# Patient Record
Sex: Female | Born: 1965 | Race: White | Hispanic: No | Marital: Married | Smoking: Never smoker
Health system: Southern US, Community
[De-identification: ages and names within clinical notes are randomized; demographics above are authoritative.]

## PROBLEM LIST (undated history)

## (undated) DIAGNOSIS — F419 Anxiety disorder, unspecified: Secondary | ICD-10-CM

## (undated) DIAGNOSIS — I10 Essential (primary) hypertension: Secondary | ICD-10-CM

## (undated) DIAGNOSIS — K219 Gastro-esophageal reflux disease without esophagitis: Secondary | ICD-10-CM

## (undated) DIAGNOSIS — L982 Febrile neutrophilic dermatosis [Sweet]: Secondary | ICD-10-CM

## (undated) HISTORY — PX: TUBAL LIGATION: SHX77

## (undated) HISTORY — DX: Gastro-esophageal reflux disease without esophagitis: K21.9

## (undated) HISTORY — DX: Febrile neutrophilic dermatosis (sweet): L98.2

## (undated) HISTORY — PX: COLONOSCOPY: SHX174

---

## 1998-01-28 ENCOUNTER — Other Ambulatory Visit: Admission: RE | Admit: 1998-01-28 | Discharge: 1998-01-28 | Payer: Self-pay | Admitting: Obstetrics and Gynecology

## 1999-02-20 ENCOUNTER — Other Ambulatory Visit: Admission: RE | Admit: 1999-02-20 | Discharge: 1999-02-20 | Payer: Self-pay | Admitting: Obstetrics and Gynecology

## 2000-04-29 ENCOUNTER — Other Ambulatory Visit: Admission: RE | Admit: 2000-04-29 | Discharge: 2000-04-29 | Payer: Self-pay | Admitting: Obstetrics and Gynecology

## 2001-07-01 ENCOUNTER — Other Ambulatory Visit: Admission: RE | Admit: 2001-07-01 | Discharge: 2001-07-01 | Payer: Self-pay | Admitting: Obstetrics and Gynecology

## 2002-08-03 ENCOUNTER — Other Ambulatory Visit: Admission: RE | Admit: 2002-08-03 | Discharge: 2002-08-03 | Payer: Self-pay | Admitting: Obstetrics and Gynecology

## 2003-07-29 ENCOUNTER — Ambulatory Visit (HOSPITAL_COMMUNITY): Admission: RE | Admit: 2003-07-29 | Discharge: 2003-07-29 | Payer: Self-pay | Admitting: Dermatology

## 2003-08-17 ENCOUNTER — Other Ambulatory Visit: Admission: RE | Admit: 2003-08-17 | Discharge: 2003-08-17 | Payer: Self-pay | Admitting: Obstetrics and Gynecology

## 2003-08-20 ENCOUNTER — Encounter: Admission: RE | Admit: 2003-08-20 | Discharge: 2003-08-20 | Payer: Self-pay | Admitting: Obstetrics and Gynecology

## 2003-11-10 ENCOUNTER — Encounter (HOSPITAL_COMMUNITY): Admission: RE | Admit: 2003-11-10 | Discharge: 2003-12-10 | Payer: Self-pay | Admitting: Oncology

## 2003-11-10 ENCOUNTER — Encounter: Admission: RE | Admit: 2003-11-10 | Discharge: 2003-11-10 | Payer: Self-pay | Admitting: Oncology

## 2004-08-22 ENCOUNTER — Encounter: Admission: RE | Admit: 2004-08-22 | Discharge: 2004-08-22 | Payer: Self-pay | Admitting: Obstetrics and Gynecology

## 2004-10-09 ENCOUNTER — Ambulatory Visit (HOSPITAL_COMMUNITY): Admission: RE | Admit: 2004-10-09 | Discharge: 2004-10-09 | Payer: Self-pay | Admitting: Obstetrics and Gynecology

## 2005-11-22 ENCOUNTER — Ambulatory Visit (HOSPITAL_COMMUNITY): Admission: RE | Admit: 2005-11-22 | Discharge: 2005-11-22 | Payer: Self-pay | Admitting: Obstetrics and Gynecology

## 2007-06-18 ENCOUNTER — Ambulatory Visit (HOSPITAL_COMMUNITY): Admission: RE | Admit: 2007-06-18 | Discharge: 2007-06-18 | Payer: Self-pay | Admitting: Family Medicine

## 2008-08-27 ENCOUNTER — Ambulatory Visit (HOSPITAL_COMMUNITY): Admission: RE | Admit: 2008-08-27 | Discharge: 2008-08-27 | Payer: Self-pay | Admitting: Family Medicine

## 2009-05-02 ENCOUNTER — Emergency Department (HOSPITAL_COMMUNITY): Admission: EM | Admit: 2009-05-02 | Discharge: 2009-05-02 | Payer: Self-pay | Admitting: Emergency Medicine

## 2010-09-01 NOTE — Op Note (Signed)
Alison Adams, Alison Adams               ACCOUNT NO.:  [id number]   MEDICAL RECORD NO.:  [id number]          PATIENT TYPE:  AMB   LOCATION:  SDC                           FACILITY:  [facility]   PHYSICIAN:  Richardean Sale, M.D.   DATE OF BIRTH:  Aug 26, 1965   DATE OF PROCEDURE:  10/09/2004  DATE OF DISCHARGE:                                 OPERATIVE REPORT   PREOPERATIVE DIAGNOSIS:  Desires permanent sterilization.   POSTOPERATIVE DIAGNOSIS:  Desires permanent sterilization.   OPERATION/PROCEDURE:  Laparoscopic tubal sterilization.   SURGEON:  Richardean Sale, M.D.   ASSISTANT:  None.   ANESTHESIA:  General.   COMPLICATIONS:  None.   ESTIMATED BLOOD LOSS:  Minimal.   SPECIMENS:  None.   FINDINGS:  Normal-appearing uterus, ovaries and fallopian tubes.  Normal-  appearing appendix.  Normal liver and gallbladder.   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  The patient presents today for tubal sterilization secondary to  unable to tolerate any hormonal contraception and the desire for permanent  sterilization.  Prior to procedure, we discussed the risks of the surgery  including but not limited to hemorrhage requiring transfusion, infection,  injury to the bowel, the bladder, the ovaries, the ureters, or the uterus  and other intra-abdominal organs which may require additional surgery either  at the time of this procedure or in the future.  I discussed the risks of  anesthesia and deep venous thrombosis and pulmonary embolus.  The patient  voiced understanding of all these risks and agreed to proceed.  We also  discussed the possibility of regret and the risk of failure of up to 1-3 out  of 100.  The patient voices desire for no future childbearing.  The patient  voices understanding of all these risks and desires to proceed.  Informed  consent obtained before proceeding  to the OR.   DESCRIPTION OF PROCEDURE:  The patient was taken to the operating room where  she was given a general anesthesia.  She was then prepped and draped in the  usual sterile fashion with Betadine and placed in the dorsal lithotomy  position.  A red rubber catheter was then used to drain the bladder.  Clear  urine was noted. Bimanual exam was performed which revealed the presence of  a midline mobile uterus, normal in size and the adnexa were not palpable.  Speculum was then placed in the vagina and the cervix was grasped with  single-tooth tenaculum.  The Hulka tenaculum was then introduced and the  single-tooth tenaculum removed.  The speculum was then removed.   Attention was then turned to the patient's abdomen after sterile gown and  gloves were donned.  Plain Marcaine 0.25%, 4 mL, injected into the  infraumbilical area where the incision was created. An additional 2 mL were  injected in the midline in the suprapubic area through the patient's prior  cesarean section scar.  A 10 mm infraumbilical skin incision was then made  with  the scalpel.  This was carried down sharply to the fascia.  The fascia  was then grasped between two Kocher clamps, elevated and entered sharply  with Mayo scissors.  This incision was then extended with the Mayo scissors  and the fascial stitch was secured with a pursestring Vicryl suture.  The  peritoneum was then identified, grasped between two hemostats and entered  sharply.  A finger was then swept into the peritoneal incision and there  were evidence of any adhesions of omentum or bowel.  At this point the  Hasson trocar and sleeve were introduced.  The camera was then inserted.  Intra-abdominal placement was confirmed.  CO2 gas was then allowed to  insufflate.  A second 5 mm skin incision was then made in the midline just  above the symphysis pubis through the patient's prior cesarean section scar.  Through this a 5 mm trocar and sleeve were  then introduced under direct  visualization.  A blunt probe was then introduced and the pelvis was  inspected with the findings noted above.  The bipolar cautery was then  introduced and the fallopian tubes were identified, carried out to the  fimbriated ends and both fallopian tubes were cauterized at the mid portion  of the tube until adequate cauterization had been performed.  The bowel was  well out of the way during any use of cautery.  Once the tubes had been  cauterized, the fibroid was then removed.  There pelvis was then inspected.  All areas were hemostatic and the procedure was then terminated.  The 5 mm  trocar was then introduced under direct visualization.  The cameras were  removed and CO2 gas was allowed to escape.  The Hasson was then removed and  the infraumbilical fascial incision was closed by tying the already placed  pursestring Vicryl suture and the skin was then closed with Dermabond.  The  Hulka tenaculum was then removed from the patient's vagina.  The patient was  taken out of the dorsal lithotomy position. She was awakened from general  anesthesia and was taken to the recovery room in stable condition.  There  were no complications.       JW/MEDQ  D:  10/09/2004  T:  10/10/2004  Job:  [id number]

## 2010-09-01 NOTE — H&P (Signed)
Alison Adams, Alison Adams               ACCOUNT NO.:  [id number]   MEDICAL RECORD NO.:  [id number]          PATIENT TYPE:  AMB   LOCATION:  SDC                           FACILITY:  [facility]   PHYSICIAN:  Richardean Sale, M.D.   DATE OF BIRTH:  1966-01-06   DATE OF ADMISSION:  10/09/2004  DATE OF DISCHARGE:                                HISTORY & PHYSICAL   PREOPERATIVE DIAGNOSES:  Desires permanent sterilization.   HISTORY OF PRESENT ILLNESS:  This is a 45 year old gravida 2, para 2 white  female who desires permanent sterilization.  Patient has been on oral  contraceptive pills and has subsequently developed a dermatologic condition  known as Sweet syndrome that is thought to be possibly secondary to the oral  contraceptive pills and it is recommended that she discontinue any hormonal  contraception.  Patient has been apprised of the risks, benefits, and  alternatives of permanent sterilization and desires to proceed.   PAST OBSTETRICAL HISTORY:  Vaginal delivery x1, cesarean section x1.   PAST GYNECOLOGICAL HISTORY:  Positive for cryotherapy in 1990 for abnormal  Pap smears.  Last Pap May 2006 was negative for intraepithelial lesion or  malignancy.  No history of other STDs.   PAST MEDICAL HISTORY:  Sweet syndrome.  No cardiopulmonary disease.   PAST SURGICAL HISTORY:  Cesarean section and cryotherapy.   SOCIAL HISTORY:  Denies tobacco, alcohol, or drugs.  Is married.   FAMILY HISTORY:  No known family members with any anesthetic-related  complications.  Otherwise, noncontributory.   PHYSICAL EXAMINATION:  VITAL SIGNS:  She is afebrile.  Vital signs are  stable.  Weight 164, height 5 feet 2 inches.  GENERAL:  She is a well-developed, well-nourished white female who is in no  acute distress.  HEART:  Regular rate and rhythm.  LUNGS:  Clear to auscultation bilaterally.  NECK:  Within normal limits.  Thyroid within normal limits without masses.  ABDOMEN:  Soft, nontender, nondistended  with no palpable masses.  There is a  prior cesarean section scar.  No hepatosplenomegaly.  No hernia.  EXTREMITIES:  No clubbing, cyanosis, edema.  PELVIC:  External genitalia are within normal limits.  Vagina pink, moist,  and rugated.  Cervix without lesions.  Bimanual examination:  The uterus is  normal size, mobile, nontender.  Adnexa not palpable.   MEDICATIONS:  OCPs, prednisone taken greater than three weeks ago.   ALLERGIES:  No known drug allergies.   ASSESSMENT:  A 45 year old gravida 2, para 2 white female who desires  permanent sterilization.   PLAN:  Risks, benefits, and alternatives to permanent sterilization have  been reviewed with the patient in detail.  I discussed the risk of  hemorrhage, infection, injury to the bowel, the bladder, other organs which  could require surgery either this hospitalization or in the future.  Discussed risks of anesthesia, DVT, pulmonary embolus, and the possibility  of tubal failure.  Patient voices understanding of these above risks and  desires to proceed.  Informed consent has been obtained.       JW/MEDQ  D:  10/09/2004  T:  10/09/2004  Job:  [id number]

## 2011-01-31 ENCOUNTER — Emergency Department (HOSPITAL_COMMUNITY): Payer: BC Managed Care – PPO

## 2011-01-31 ENCOUNTER — Emergency Department (HOSPITAL_COMMUNITY)
Admission: EM | Admit: 2011-01-31 | Discharge: 2011-01-31 | Disposition: A | Discharge status: Home / Self Care | Payer: BC Managed Care – PPO | Attending: Emergency Medicine | Admitting: Emergency Medicine

## 2011-01-31 ENCOUNTER — Other Ambulatory Visit: Payer: Self-pay

## 2011-01-31 DIAGNOSIS — M25519 Pain in unspecified shoulder: Secondary | ICD-10-CM | POA: Insufficient documentation

## 2011-01-31 DIAGNOSIS — M542 Cervicalgia: Secondary | ICD-10-CM | POA: Insufficient documentation

## 2011-01-31 DIAGNOSIS — R0789 Other chest pain: Secondary | ICD-10-CM

## 2011-01-31 HISTORY — DX: Anxiety disorder, unspecified: F41.9

## 2011-01-31 LAB — CBC
HCT: 40.8 % (ref 36.0–46.0)
MCHC: 33.1 g/dL (ref 30.0–36.0)
MCV: 92.5 fL (ref 78.0–100.0)
WBC: 9 10*3/uL (ref 4.0–10.5)

## 2011-01-31 LAB — COMPREHENSIVE METABOLIC PANEL
ALT: 8 U/L (ref 0–35)
AST: 12 U/L (ref 0–37)
Alkaline Phosphatase: 52 U/L (ref 39–117)
Calcium: 9.2 mg/dL (ref 8.4–10.5)

## 2011-01-31 LAB — DIFFERENTIAL
Basophils Absolute: 0.1 10*3/uL (ref 0.0–0.1)
Basophils Relative: 1 % (ref 0–1)
Eosinophils Absolute: 0.2 10*3/uL (ref 0.0–0.7)
Eosinophils Relative: 2 % (ref 0–5)
Lymphocytes Relative: 29 % (ref 12–46)
Lymphs Abs: 2.6 10*3/uL (ref 0.7–4.0)
Monocytes Absolute: 0.6 10*3/uL (ref 0.1–1.0)
Monocytes Relative: 7 % (ref 3–12)
Neutro Abs: 5.5 10*3/uL (ref 1.7–7.7)
Neutrophils Relative %: 61 % (ref 43–77)

## 2011-01-31 LAB — D-DIMER, QUANTITATIVE: D-Dimer, Quant: 0.53 ug/mL-FEU — ABNORMAL HIGH (ref 0.00–0.48)

## 2011-01-31 LAB — CARDIAC PANEL(CRET KIN+CKTOT+MB+TROPI)
Relative Index: INVALID (ref 0.0–2.5)
Total CK: 82 U/L (ref 7–177)
Troponin I: <0.3 ng/mL (ref <0.30)
Troponin I: <0.3 ng/mL (ref <0.30)

## 2011-01-31 MED ORDER — SODIUM CHLORIDE 0.9 % IV SOLN
Freq: Once | INTRAVENOUS | Status: AC
  Administered 2011-01-31: 18:00:00 via INTRAVENOUS

## 2011-01-31 MED ORDER — HYDROCODONE-ACETAMINOPHEN 5-325 MG PO TABS: 1.0000 | ORAL_TABLET | Freq: Four times a day (QID) | ORAL | Status: AC | PRN

## 2011-01-31 MED ORDER — IOHEXOL 350 MG/ML SOLN
100.0000 mL | Freq: Once | INTRAVENOUS | Status: AC | PRN
  Administered 2011-01-31: 100 mL via INTRAVENOUS

## 2011-01-31 NOTE — ED Provider Notes (Signed)
Scribed for Benny Lennert, MD, the patient was seen in room APA10/APA10. This chart was scribed by [employer]. The patient's care started at 17:05  CSN: [id number] Arrival date & time: 01/31/2011  4:41 PM   First MD Initiated Contact with Patient 01/31/11 1705      Chief Complaint  Patient presents with  . Chest Pain   HPI Alison Adams is a 45 y.o. female who presents to the Emergency Department complaining of intermittent, dull and aching right sided chest pain radiating into the right neck, shoulder and back, onset last weekend. Patient reports that pain is alleviated by laying still and aggravated by activity. Pain is not modified by taking deep breaths. Denies SOB or diaphoresis, vomiting or fever. Reports she went to Dr Fletcher Anon office today and was sent her for evaluation. This is a new case. There are no other associated symptoms and no other alleviating or aggravating factors.  Past Medical History  Diagnosis Date  . Anxiety    Past Family History Father - Heart problems.  Past Surgical History  Procedure Date  . Cesarean section     Family History  Problem Relation Age of Onset  . Coronary artery disease Father     History  Substance Use Topics  . Smoking status: Never Smoker   . Smokeless tobacco: Not on file  . Alcohol Use: No    OB History    Grav Para Term Preterm Abortions TAB SAB Ect Mult Living                  Review of Systems  Constitutional: Negative for fever, diaphoresis and fatigue.  HENT: Negative for congestion, sinus pressure and ear discharge.   Eyes: Negative for discharge.  Respiratory: Negative for cough and shortness of breath.   Cardiovascular: Negative for chest pain.  Gastrointestinal: Negative for nausea, vomiting, abdominal pain and diarrhea.  Genitourinary: Negative for frequency and hematuria.  Musculoskeletal: Negative for back pain.  Skin: Negative for rash.  Neurological: Negative for seizures and headaches.    Hematological: Negative.   Psychiatric/Behavioral: Negative for hallucinations.  All other systems reviewed and are negative.    Allergies  Review of patient's allergies indicates no known allergies.  Home Medications  No current outpatient prescriptions on file.  BP 130/85  Pulse 76  Temp(Src) 98.2 F (36.8 C) (Oral)  Resp 18  Ht 5\' 2"  (1.575 m)  Wt 140 lb (63.504 kg)  BMI 25.61 kg/m2  SpO2 97%  Physical Exam  Constitutional: She is oriented to person, place, and time. She appears well-developed and well-nourished. No distress.  HENT:  Head: Normocephalic and atraumatic.  Mouth/Throat: Oropharynx is clear and moist.  Eyes: Conjunctivae and EOM are normal. Right eye exhibits no discharge. Left eye exhibits no discharge. No scleral icterus.  Neck: Neck supple. No thyromegaly present.  Cardiovascular: Normal rate, regular rhythm and normal heart sounds.  Exam reveals no gallop and no friction rub.   No murmur heard. Pulmonary/Chest: Effort normal and breath sounds normal. No stridor. No respiratory distress. She has no wheezes. She has no rales. She exhibits no tenderness.  Abdominal: Soft. Bowel sounds are normal. She exhibits no distension. There is no tenderness. There is no rebound.  Musculoskeletal: Normal range of motion. She exhibits no edema.  Lymphadenopathy:    She has no cervical adenopathy.  Neurological: She is alert and oriented to person, place, and time. No cranial nerve deficit. Coordination normal.  Skin: Skin is warm and dry.  No rash noted. She is not diaphoretic. No erythema.  Psychiatric: She has a normal mood and affect. Her behavior is normal.    ED Course  Procedures  DIAGNOSTIC STUDIES: Oxygen Saturation is 97% on room air, normal by my interpretation.    COORDINATION OF CARE: 17:10 - EDP examined patient and ordered the following Orders Placed This Encounter  Procedures  . DG Chest 2 View  . CBC  . Differential  . Cardiac panel(cret  kin+cktot+mb+tropi)  . Comprehensive metabolic panel  . D-dimer, quantitative  . Cardiac monitoring  . ED EKG     Dg Chest 2 View  01/31/2011  *RADIOLOGY REPORT*  Clinical Data: Chest, left shoulder and left neck pain.  CHEST - 2 VIEW  Comparison: 11/15/2003  Findings: Heart and mediastinal contours are within normal limits. No focal opacities or effusions.  No acute bony abnormality.  IMPRESSION: No active cardiopulmonary disease.  Original Report Authenticated By: Cyndie Chime, M.D.    Ct Angio Chest W/cm &/or Wo Cm  01/31/2011  *RADIOLOGY REPORT*  Clinical Data:  Pain  CT ANGIOGRAPHY CHEST WITH CONTRAST  Technique:  Multidetector CT imaging of the chest was performed using the standard protocol during bolus administration of intravenous contrast.  Multiplanar CT image reconstructions including MIPs were obtained to evaluate the vascular anatomy.  Contrast: OMNIPAQUE IOHEXOL 350 MG/ML IV SOLN  Comparison:  11/15/2003  Findings:  There are no filling defects in the pulmonary arterial tree to suggest acute pulmonary thromboembolism.  Negative abnormal mediastinal adenopathy.  No pericardial effusion.  Small visceral pleural node on the right on image 23.  Minimal basilar atelectasis.  Review of the MIP images confirms the above findings.  IMPRESSION: No evidence of acute pulmonary thromboembolism.  Original Report Authenticated By: Donavan Burnet, M.D.    Results for orders placed during the hospital encounter of 01/31/11  CBC      Component Value Range   WBC 9.0  4.0 - 10.5 (K/uL)   RBC 4.41  3.87 - 5.11 (MIL/uL)   Hemoglobin 13.5  12.0 - 15.0 (g/dL)   HCT 96.0  45.4 - 09.8 (%)   MCV 92.5  78.0 - 100.0 (fL)   MCH 30.6  26.0 - 34.0 (pg)   MCHC 33.1  30.0 - 36.0 (g/dL)   RDW 11.9  14.7 - 82.9 (%)   Platelets 303  150 - 400 (K/uL)  DIFFERENTIAL      Component Value Range   Neutrophils Relative 61  43 - 77 (%)   Neutro Abs 5.5  1.7 - 7.7 (K/uL)   Lymphocytes Relative 29  12 - 46  (%)   Lymphs Abs 2.6  0.7 - 4.0 (K/uL)   Monocytes Relative 7  3 - 12 (%)   Monocytes Absolute 0.6  0.1 - 1.0 (K/uL)   Eosinophils Relative 2  0 - 5 (%)   Eosinophils Absolute 0.2  0.0 - 0.7 (K/uL)   Basophils Relative 1  0 - 1 (%)   Basophils Absolute 0.1  0.0 - 0.1 (K/uL)  CARDIAC PANEL(CRET KIN+CKTOT+MB+TROPI)      Component Value Range   Total CK 82  7 - 177 (U/L)   CK, MB 2.5  0.3 - 4.0 (ng/mL)   Troponin I <0.30  <0.30 (ng/mL)   Relative Index RELATIVE INDEX IS INVALID  0.0 - 2.5   COMPREHENSIVE METABOLIC PANEL      Component Value Range   Sodium 138  135 - 145 (mEq/L)   Potassium  4.2  3.5 - 5.1 (mEq/L)   Chloride 102  96 - 112 (mEq/L)   CO2 28  19 - 32 (mEq/L)   Glucose, Bld 87  70 - 99 (mg/dL)   BUN 10  6 - 23 (mg/dL)   Creatinine, Ser 1.61  0.50 - 1.10 (mg/dL)   Calcium 9.2  8.4 - 09.6 (mg/dL)   Total Protein 6.2  6.0 - 8.3 (g/dL)   Albumin 3.6  3.5 - 5.2 (g/dL)   AST 12  0 - 37 (U/L)   ALT 8  0 - 35 (U/L)   Alkaline Phosphatase 52  39 - 117 (U/L)   Total Bilirubin 0.3  0.3 - 1.2 (mg/dL)   GFR calc non Af Amer >90  >90 (mL/min)   GFR calc Af Amer >90  >90 (mL/min)  D-DIMER, QUANTITATIVE      Component Value Range   D-Dimer, Quant 0.53 (*) 0.00 - 0.48 (ug/mL-FEU)  CARDIAC PANEL(CRET KIN+CKTOT+MB+TROPI)      Component Value Range   Total CK 75  7 - 177 (U/L)   CK, MB 2.4  0.3 - 4.0 (ng/mL)   Troponin I <0.30  <0.30 (ng/mL)   Relative Index RELATIVE INDEX IS INVALID  0.0 - 2.5           Date: 01/31/2011  Rate: 89  Rhythm: normal sinus rhythm  QRS Axis: normal  Intervals: normal  ST/T Wave abnormalities: normal  Conduction Disutrbances:none  Narrative Interpretation:   Old EKG Reviewed: none available     Mdm.  Noncardiac chest pain  Benny Lennert, MD 01/31/11 2146

## 2011-01-31 NOTE — ED Notes (Signed)
Pt reports pain in left side of chest that radiates to left shoulder and into neck  Since the weekend.  Went to Dr. Fletcher Anon office today and was sent here for evaluation.

## 2011-02-24 IMAGING — CT CT CERVICAL SPINE W/O CM
4 of 9 series · 11 of 33 positions shown, 12 images · non-contrast
Comparison: None

CT HEAD

CLINICAL DATA: Status post fall down stairs; injury to head, with
left head and neck pain.

CT HEAD WITHOUT CONTRAST AND CT CERVICAL SPINE WITHOUT CONTRAST
TECHNIQUE: Multidetector CT imaging of the head and cervical spine
was performed following the standard protocol without intravenous
contrast.  Multiplanar CT image reconstructions of the cervical
spine were also generated.

[Series 4: cervical st 2.0 b31s · axial · 0.23mm/px · z∈[+165,+243]mm · 3 of 79 slices shown, 4 images]
[im 20/79  soft-tissue]
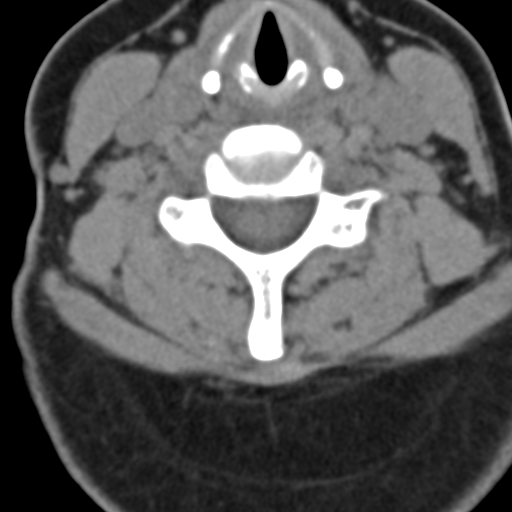
[im 20/79  bone]
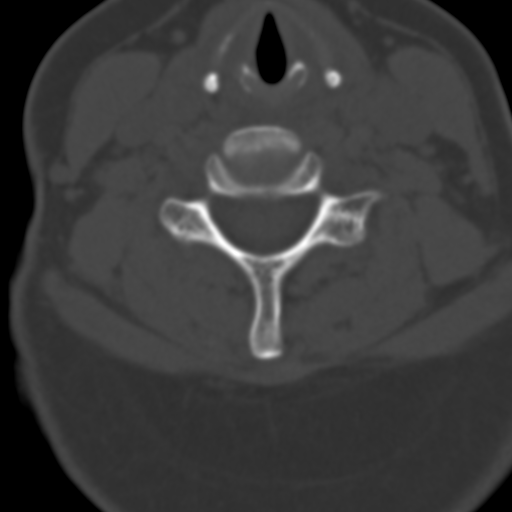
[im 40/79  bone]
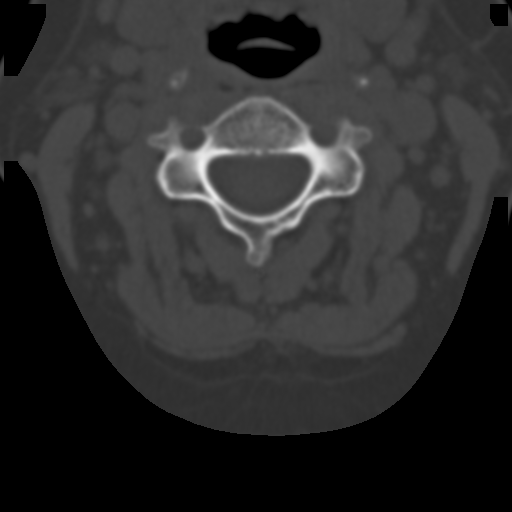
[im 59/79  bone]
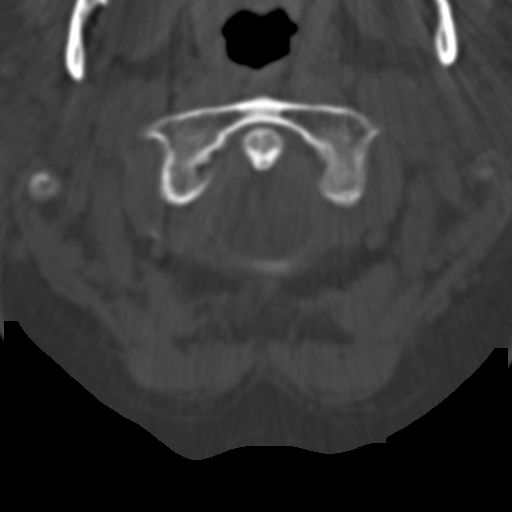

[Series 7: cervical coro (id) · coronal · 0.15mm/px · 1 of 41 slices shown]
[im 21/41  bone]
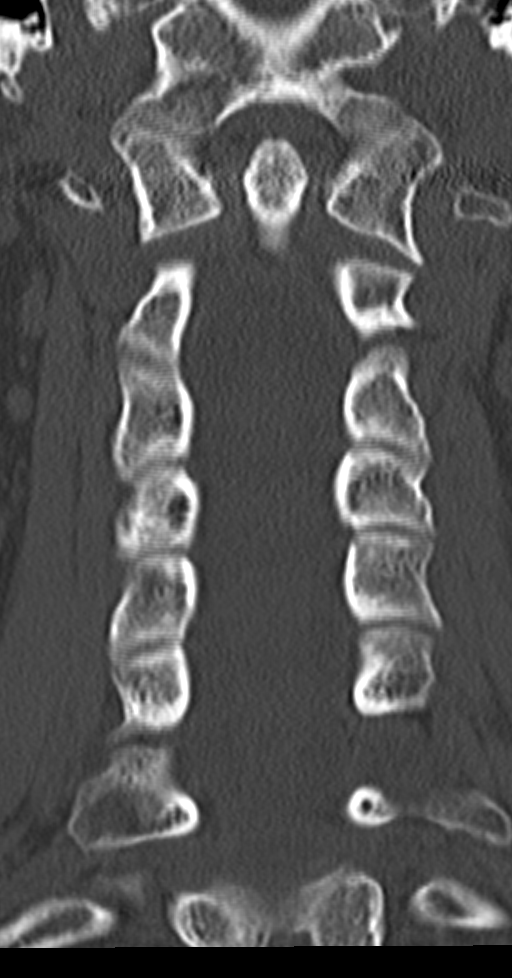

[Series 8: cervical sag (id) · sagittal · 0.17mm/px · 5 of 40 slices shown]
[im 7/40  bone]
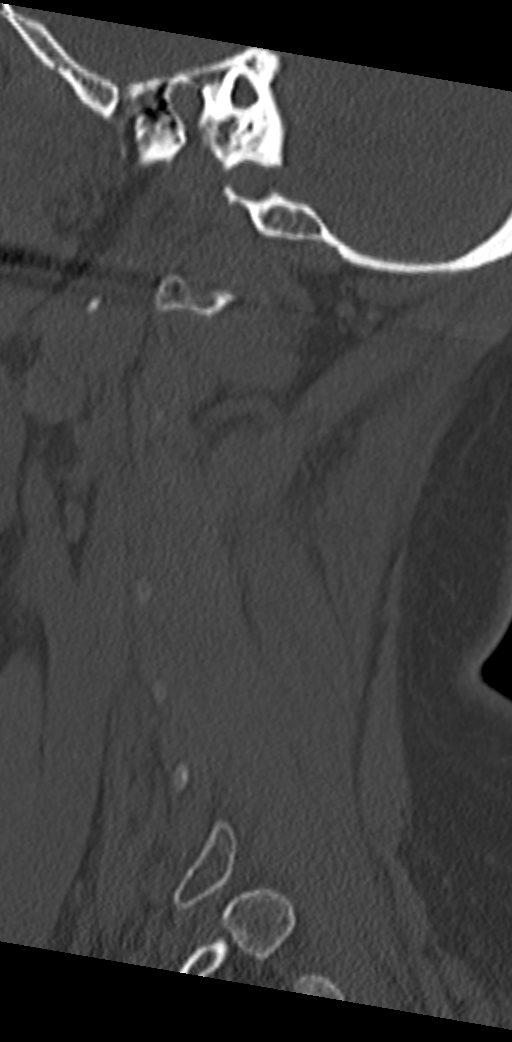
[im 14/40  bone]
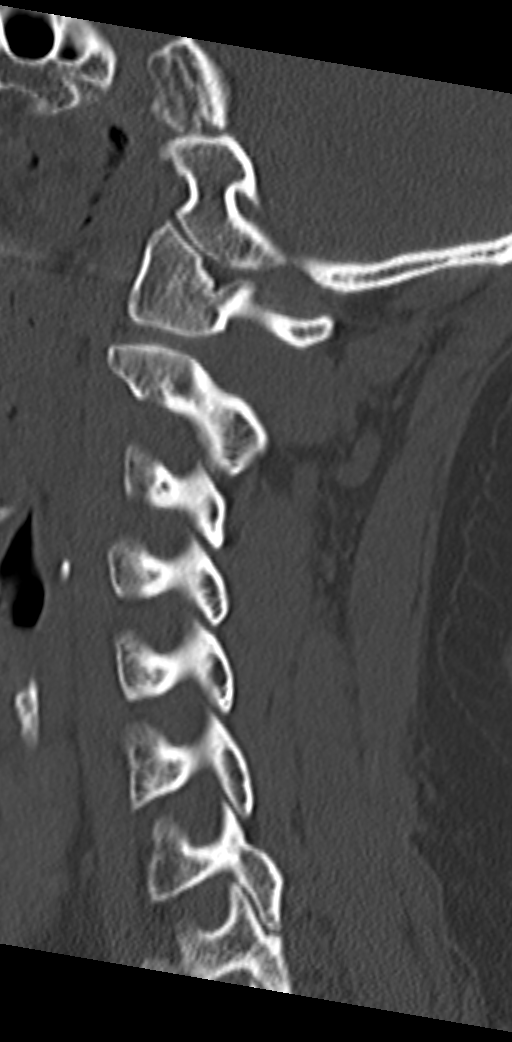
[im 20/40  bone]
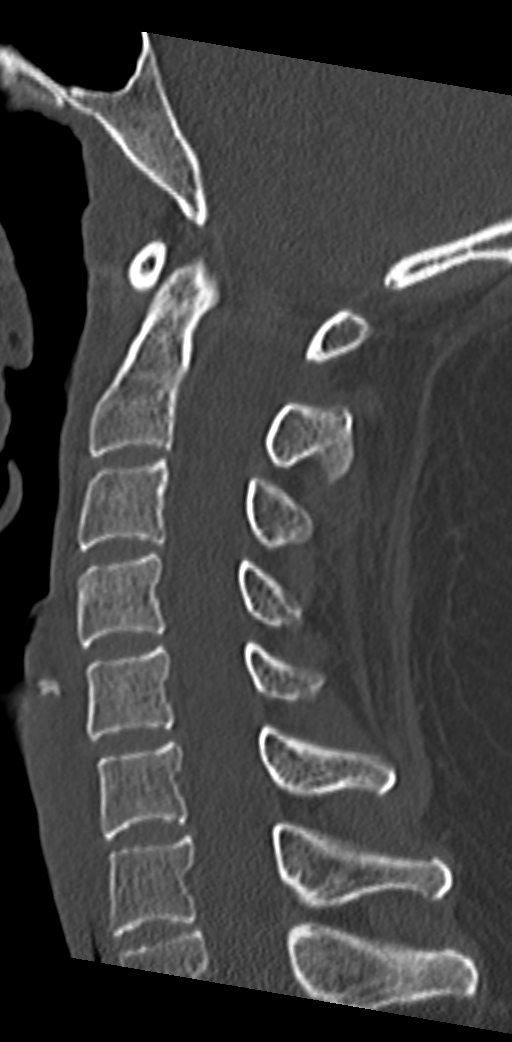
[im 27/40  bone]
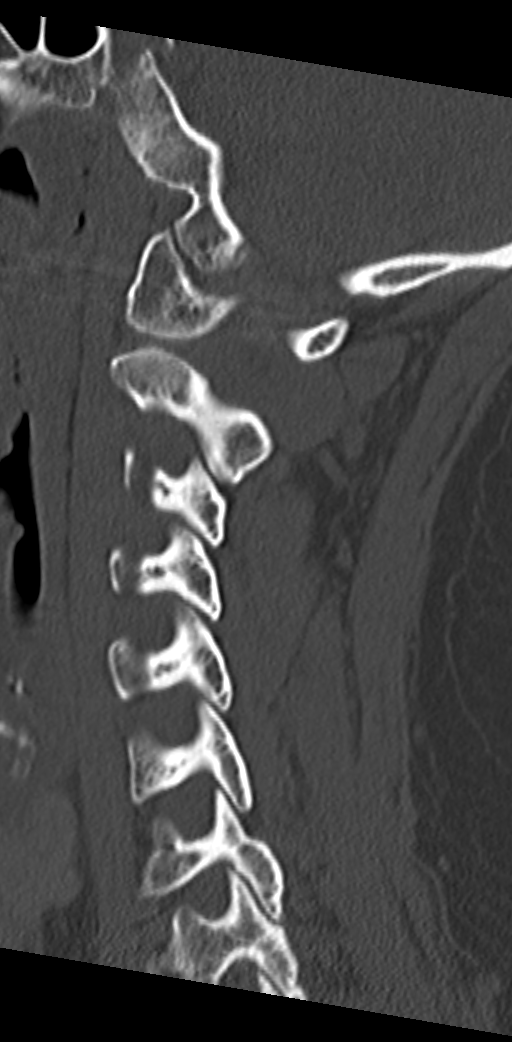
[im 33/40  bone]
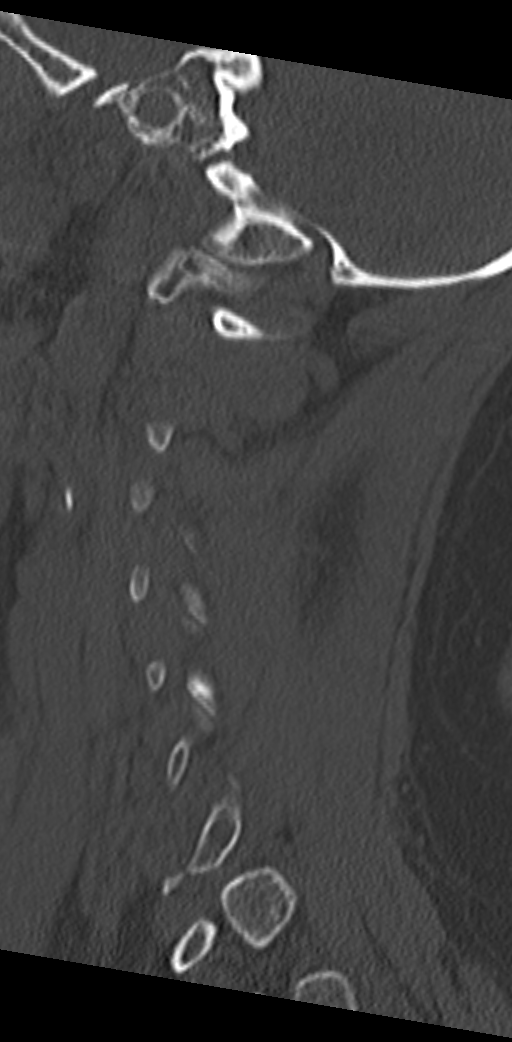

[Series 9: cervical axial (id) · axial · 0.15mm/px · z∈[+167,+216]mm · 2 of 77 slices shown]
[im 26/77  bone]
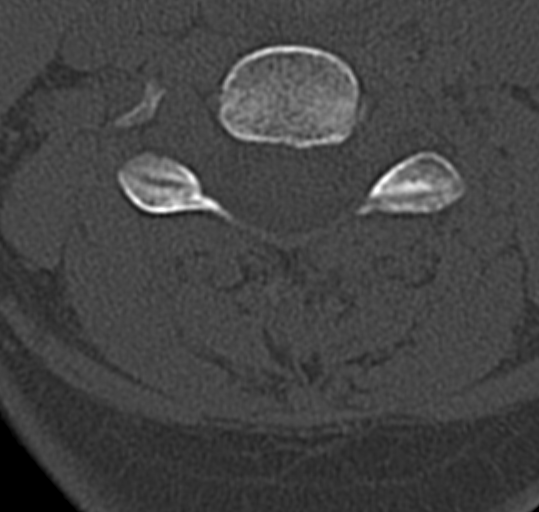
[im 51/77  bone]
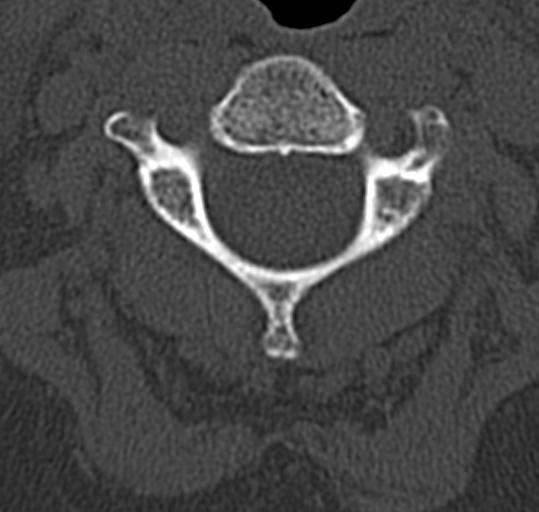

[11 of 33 positions shown; findings below may reference images not displayed]

FINDINGS: There is no evidence of acute infarction, mass lesion, or
intra- or extra-axial hemorrhage on CT. A tiny focus of increased
density at the anterior aspect of the left thalamus likely reflects
calcification.

The posterior fossa, including the cerebellum, brainstem and fourth
ventricle, is within normal limits.  The third and lateral
ventricles, and basal ganglia are unremarkable in appearance.  The
cerebral hemispheres are symmetric in appearance, with normal gray-
white differentiation.  No mass effect or midline shift is seen.

There is no evidence of fracture; visualized osseous structures are
unremarkable in appearance.  The visualized portions of the orbits
are within normal limits.  The paranasal sinuses and mastoid air
cells are well-aerated.  No significant soft tissue abnormalities
are seen.
IMPRESSION: No evidence of traumatic intracranial injury or fracture.

CT CERVICAL SPINE
FINDINGS: There is no evidence of fracture or subluxation.
Vertebral bodies demonstrate normal height and alignment.
Intervertebral disc spaces are preserved.  Prevertebral soft
tissues are within normal limits.  The visualized neural foramina
are grossly unremarkable.

The thyroid gland is unremarkable in appearance.  No significant
soft tissue abnormalities are seen.
IMPRESSION: No evidence of fracture or subluxation along the cervical spine.

## 2011-09-19 ENCOUNTER — Other Ambulatory Visit: Payer: Self-pay | Admitting: Family Medicine

## 2011-09-19 DIAGNOSIS — Z139 Encounter for screening, unspecified: Secondary | ICD-10-CM

## 2011-09-20 ENCOUNTER — Ambulatory Visit (HOSPITAL_COMMUNITY): Payer: BC Managed Care – PPO

## 2011-09-21 ENCOUNTER — Ambulatory Visit (HOSPITAL_COMMUNITY)
Admission: RE | Admit: 2011-09-21 | Discharge: 2011-09-21 | Disposition: A | Discharge status: Home / Self Care | Payer: BC Managed Care – PPO | Source: Ambulatory Visit | Attending: Family Medicine | Admitting: Family Medicine

## 2011-09-21 DIAGNOSIS — Z1231 Encounter for screening mammogram for malignant neoplasm of breast: Secondary | ICD-10-CM | POA: Insufficient documentation

## 2011-09-21 DIAGNOSIS — Z139 Encounter for screening, unspecified: Secondary | ICD-10-CM

## 2012-07-04 ENCOUNTER — Encounter: Payer: Self-pay | Admitting: *Deleted

## 2012-07-04 DIAGNOSIS — L982 Febrile neutrophilic dermatosis [Sweet]: Secondary | ICD-10-CM

## 2012-08-01 ENCOUNTER — Other Ambulatory Visit: Payer: Self-pay | Admitting: Family Medicine

## 2012-09-02 ENCOUNTER — Other Ambulatory Visit: Payer: Self-pay | Admitting: Family Medicine

## 2012-09-03 ENCOUNTER — Other Ambulatory Visit: Payer: Self-pay | Admitting: *Deleted

## 2012-09-03 MED ORDER — PANTOPRAZOLE SODIUM 40 MG PO TBEC: 40.0000 mg | DELAYED_RELEASE_TABLET | Freq: Every day | ORAL | Status: DC

## 2012-10-06 ENCOUNTER — Ambulatory Visit (INDEPENDENT_AMBULATORY_CARE_PROVIDER_SITE_OTHER): Payer: BC Managed Care – PPO | Admitting: Nurse Practitioner

## 2012-10-06 ENCOUNTER — Encounter: Payer: Self-pay | Admitting: Nurse Practitioner

## 2012-10-06 VITALS — BP 146/98 | HR 80 | Wt 165.4 lb

## 2012-10-06 DIAGNOSIS — K219 Gastro-esophageal reflux disease without esophagitis: Secondary | ICD-10-CM

## 2012-10-06 MED ORDER — PANTOPRAZOLE SODIUM 40 MG PO TBEC: 40.0000 mg | DELAYED_RELEASE_TABLET | Freq: Every day | ORAL | Status: DC

## 2012-10-06 NOTE — Patient Instructions (Signed)

## 2012-10-09 ENCOUNTER — Encounter: Payer: Self-pay | Admitting: Nurse Practitioner

## 2012-10-09 DIAGNOSIS — K219 Gastro-esophageal reflux disease without esophagitis: Secondary | ICD-10-CM | POA: Insufficient documentation

## 2012-10-09 NOTE — Progress Notes (Signed)
Subjective:  Presents for followup on reflux disease. Well controlled with Protonix. Gets regular female physicals with her gynecologist. Will have routine lab work done through her employer this fall. No abdominal pain. No chest pain or shortness of breath. Prozac working well.  Objective:   BP 146/98  Pulse 80  Wt 165 lb 6.4 oz (75.025 kg)  BMI 30.24 kg/m2  LMP 09/15/2012 NAD. Alert, oriented. Cheerful affect. Lungs clear. Heart regular rate rhythm. Abdomen soft nondistended nontender.  Assessment:GERD (gastroesophageal reflux disease)  Plan: Meds ordered this encounter  Medications  . pantoprazole (PROTONIX) 40 MG tablet    Sig: Take 1 tablet (40 mg total) by mouth daily.    Dispense:  30 tablet    Refill:  5    Order Specific Question:  Supervising Provider    Answer:  Merlyn Albert [[id number]]   Recheck in 6 months, call back sooner if any problems. Patient's blood pressure was slightly elevated today which is unusual for her. Discussed lifestyle factors affecting her blood pressure. Recommended checking outside the office and call back if it remains elevated.

## 2012-10-09 NOTE — Assessment & Plan Note (Signed)
Continue Protonix as directed.

## 2012-11-24 IMAGING — CR DG CHEST 2V
2 series · 2 of 2 positions shown · non-contrast
Comparison: 11/15/2003

CLINICAL DATA: Chest, left shoulder and left neck pain.

CHEST - 2 VIEW

[view not recorded (1 of 2)]
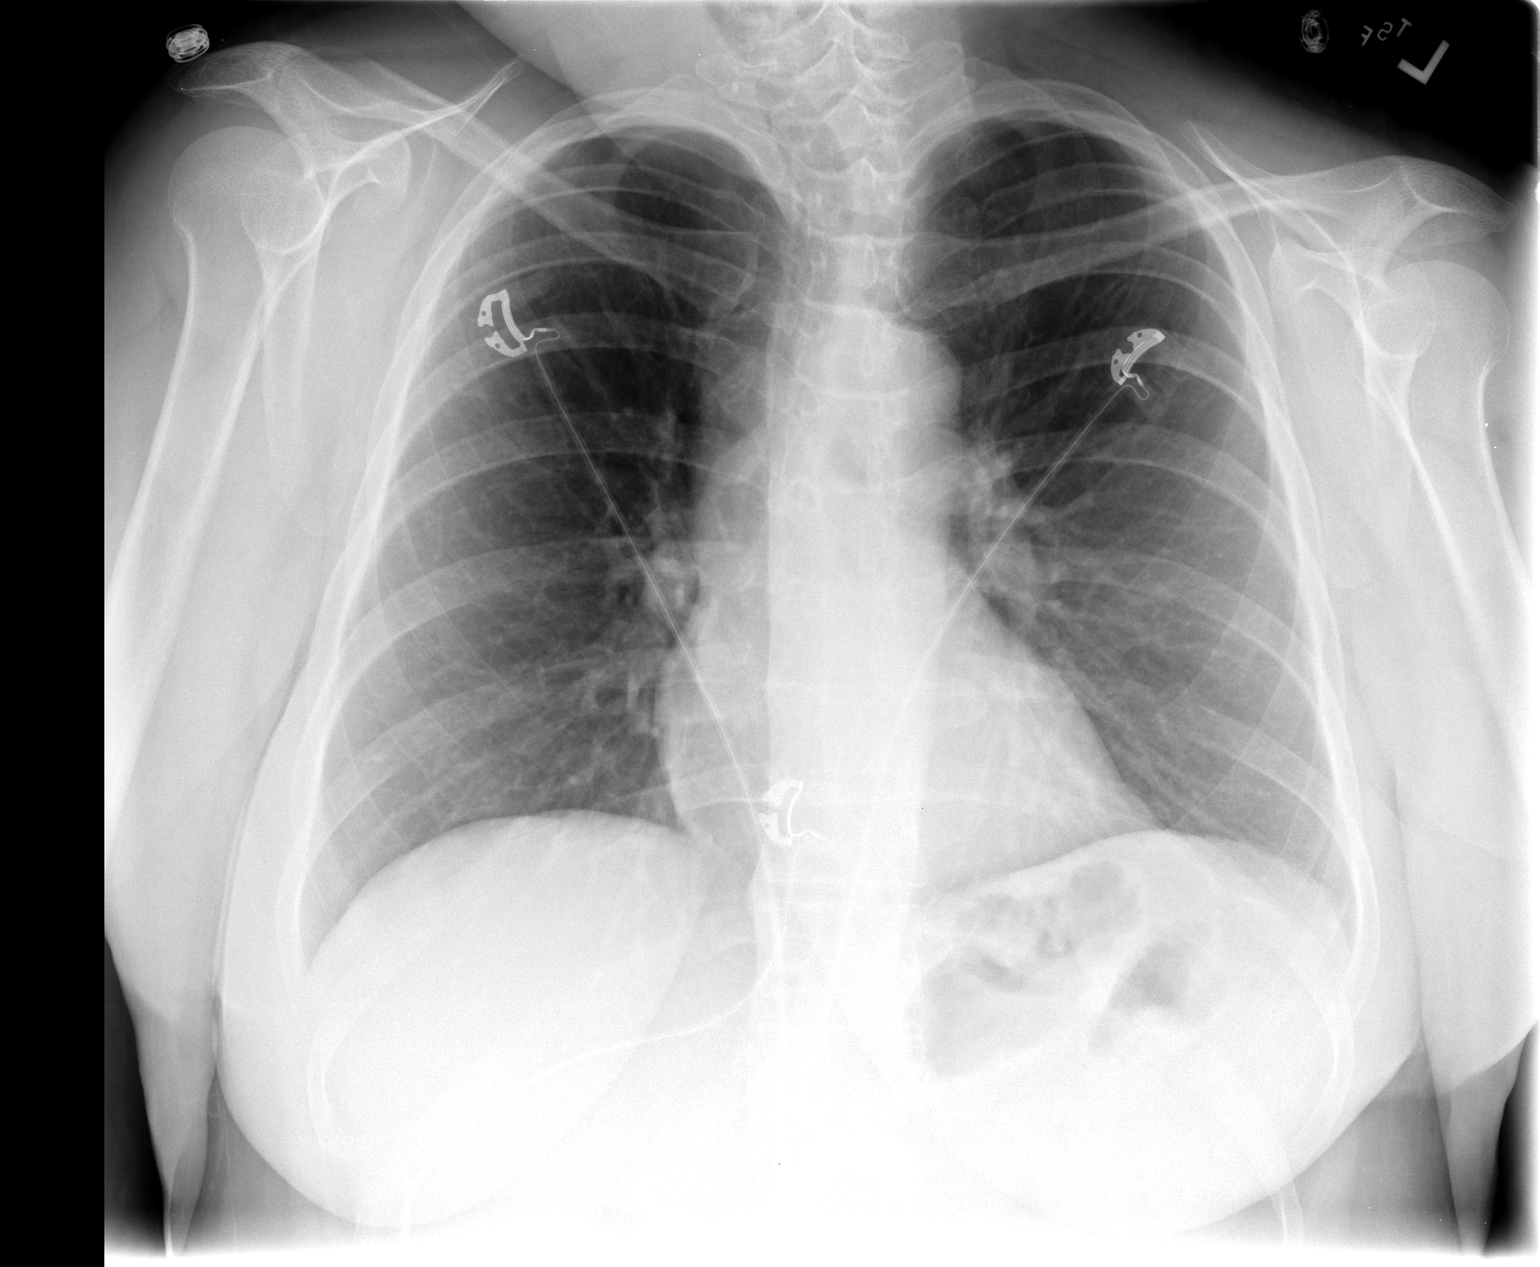

[view not recorded (2 of 2)]
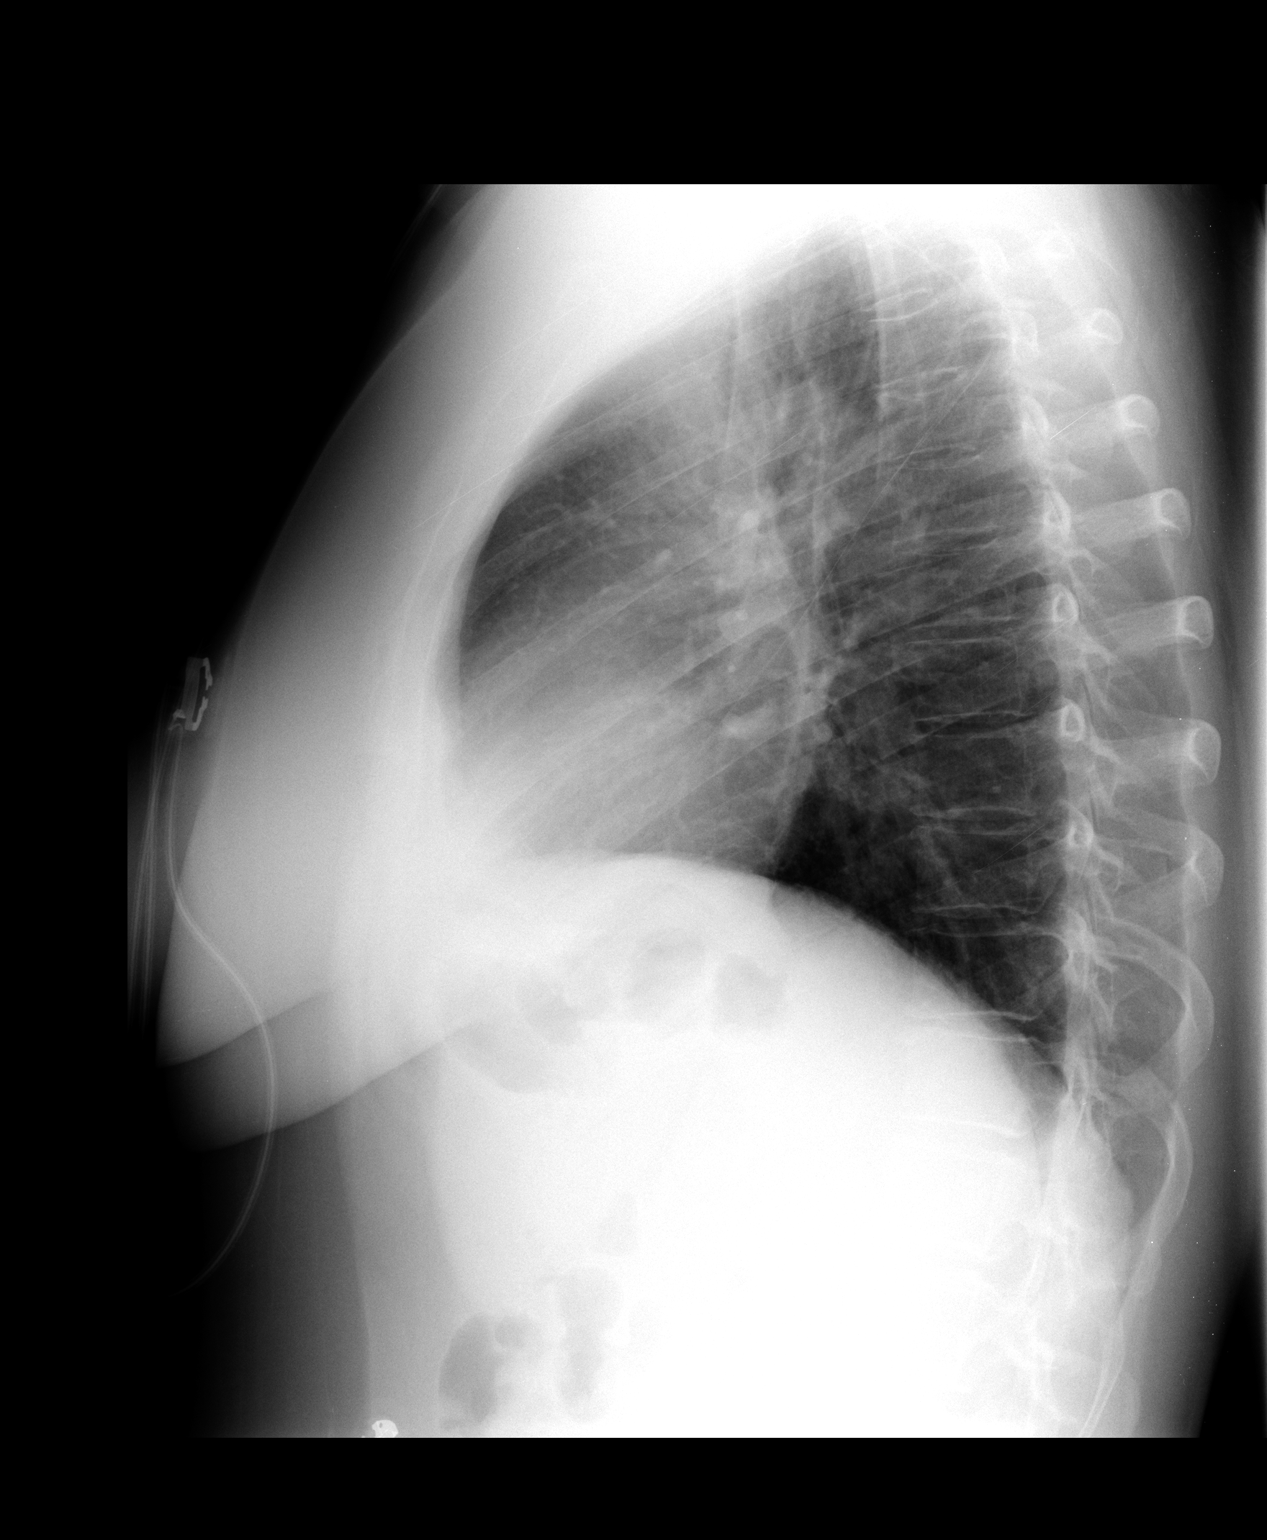

[2 of 2 positions shown; findings below may reference images not displayed]

FINDINGS: Heart and mediastinal contours are within normal limits.
No focal opacities or effusions.  No acute bony abnormality.
IMPRESSION: No active cardiopulmonary disease.

## 2012-11-25 ENCOUNTER — Other Ambulatory Visit: Payer: Self-pay | Admitting: Family Medicine

## 2012-11-25 DIAGNOSIS — Z139 Encounter for screening, unspecified: Secondary | ICD-10-CM

## 2012-11-27 ENCOUNTER — Ambulatory Visit (HOSPITAL_COMMUNITY)
Admission: RE | Admit: 2012-11-27 | Discharge: 2012-11-27 | Disposition: A | Discharge status: Home / Self Care | Payer: BC Managed Care – PPO | Source: Ambulatory Visit | Attending: Family Medicine | Admitting: Family Medicine

## 2012-11-27 DIAGNOSIS — Z1231 Encounter for screening mammogram for malignant neoplasm of breast: Secondary | ICD-10-CM | POA: Insufficient documentation

## 2012-11-27 DIAGNOSIS — Z139 Encounter for screening, unspecified: Secondary | ICD-10-CM

## 2012-12-01 ENCOUNTER — Telehealth: Payer: Self-pay | Admitting: Family Medicine

## 2012-12-01 NOTE — Telephone Encounter (Addendum)
Mammogram was normal-see report.  Patient notified. Patient verbalized understanding.

## 2012-12-01 NOTE — Telephone Encounter (Signed)
Patient calling to check on mammogram results

## 2012-12-01 NOTE — Telephone Encounter (Signed)
Left message to return call 

## 2013-04-13 ENCOUNTER — Other Ambulatory Visit: Payer: Self-pay | Admitting: Nurse Practitioner

## 2013-07-16 ENCOUNTER — Telehealth: Payer: Self-pay | Admitting: Family Medicine

## 2013-07-16 NOTE — Telephone Encounter (Signed)
Rx prior auth obtained for pt's PANTOPRAZOLE 40mg , once daily, expires 07/16/2014 through [employer]

## 2013-08-05 ENCOUNTER — Encounter: Payer: Self-pay | Admitting: Internal Medicine

## 2013-08-24 ENCOUNTER — Encounter: Payer: Self-pay | Admitting: Internal Medicine

## 2013-10-07 ENCOUNTER — Ambulatory Visit (AMBULATORY_SURGERY_CENTER): Payer: Self-pay | Admitting: *Deleted

## 2013-10-07 VITALS — Ht 62.5 in | Wt 167.8 lb

## 2013-10-07 DIAGNOSIS — Z1211 Encounter for screening for malignant neoplasm of colon: Secondary | ICD-10-CM

## 2013-10-07 MED ORDER — MOVIPREP 100 G PO SOLR: 1.0000 | Freq: Once | ORAL | Status: DC

## 2013-10-07 NOTE — Progress Notes (Signed)
Patient denies any allergies to eggs or soy. Patient denies any problems with anesthesia/sedation. Patient denies any oxygen use at home and does not take any diet/weight loss medications. EMMI education assisgned to patient on colonoscopy, this was explained and instructions given to patient. 

## 2013-10-08 ENCOUNTER — Encounter: Payer: Self-pay | Admitting: Internal Medicine

## 2013-10-09 ENCOUNTER — Encounter: Payer: Self-pay | Admitting: Internal Medicine

## 2013-10-21 ENCOUNTER — Encounter: Payer: Self-pay | Admitting: Internal Medicine

## 2013-10-21 ENCOUNTER — Ambulatory Visit (AMBULATORY_SURGERY_CENTER): Payer: BC Managed Care – PPO | Admitting: Internal Medicine

## 2013-10-21 VITALS — BP 122/84 | HR 71 | Temp 98.4°F | Resp 17 | Ht 62.0 in | Wt 167.0 lb

## 2013-10-21 DIAGNOSIS — Z800 Family history of malignant neoplasm of digestive organs: Secondary | ICD-10-CM

## 2013-10-21 DIAGNOSIS — Z1211 Encounter for screening for malignant neoplasm of colon: Secondary | ICD-10-CM

## 2013-10-21 MED ORDER — SODIUM CHLORIDE 0.9 % IV SOLN: 500.0000 mL | INTRAVENOUS | Status: DC

## 2013-10-21 NOTE — Patient Instructions (Signed)
Discharge instructions given with verbal understanding. Normal exam. Resume previous medications. YOU HAD AN ENDOSCOPIC PROCEDURE TODAY AT THE [facility]: Refer to the procedure report that was given to you for any specific questions about what was found during the examination.  If the procedure report does not answer your questions, please call your gastroenterologist to clarify.  If you requested that your care partner not be given the details of your procedure findings, then the procedure report has been included in a sealed envelope for you to review at your convenience later.  YOU SHOULD EXPECT: Some feelings of bloating in the abdomen. Passage of more gas than usual.  Walking can help get rid of the air that was put into your GI tract during the procedure and reduce the bloating. If you had a lower endoscopy (such as a colonoscopy or flexible sigmoidoscopy) you may notice spotting of blood in your stool or on the toilet paper. If you underwent a bowel prep for your procedure, then you may not have a normal bowel movement for a few days.  DIET: Your first meal following the procedure should be a light meal and then it is ok to progress to your normal diet.  A half-sandwich or bowl of soup is an example of a good first meal.  Heavy or fried foods are harder to digest and may make you feel nauseous or bloated.  Likewise meals heavy in dairy and vegetables can cause extra gas to form and this can also increase the bloating.  Drink plenty of fluids but you should avoid alcoholic beverages for 24 hours.  ACTIVITY: Your care partner should take you home directly after the procedure.  You should plan to take it easy, moving slowly for the rest of the day.  You can resume normal activity the day after the procedure however you should NOT DRIVE or use heavy machinery for 24 hours (because of the sedation medicines used during the test).    SYMPTOMS TO REPORT IMMEDIATELY: A gastroenterologist  can be reached at any hour.  During normal business hours, 8:30 AM to 5:00 PM Monday through Friday, call [phone].  After hours and on weekends, please call the GI answering service at [phone] who will take a message and have the physician on call contact you.   Following lower endoscopy (colonoscopy or flexible sigmoidoscopy):  Excessive amounts of blood in the stool  Significant tenderness or worsening of abdominal pains  Swelling of the abdomen that is new, acute  Fever of 100F or higher  FOLLOW UP: If any biopsies were taken you will be contacted by phone or by letter within the next 1-3 weeks.  Call your gastroenterologist if you have not heard about the biopsies in 3 weeks.  Our staff will call the home number listed on your records the next business day following your procedure to check on you and address any questions or concerns that you may have at that time regarding the information given to you following your procedure. This is a courtesy call and so if there is no answer at the home number and we have not heard from you through the emergency physician on call, we will assume that you have returned to your regular daily activities without incident.  SIGNATURES/CONFIDENTIALITY: You and/or your care partner have signed paperwork which will be entered into your electronic medical record.  These signatures attest to the fact that that the information above on your After Visit Summary has been reviewed   and is understood.  Full responsibility of the confidentiality of this discharge information lies with you and/or your care-partner. 

## 2013-10-21 NOTE — Op Note (Signed)
[facility] [address] [place], [id number]   COLONOSCOPY PROCEDURE REPORT  PATIENT: Alison BurdockLovings, Alison C.  MR#: [id number] BIRTHDATE: 1965/07/21 , 47  yrs. old GENDER: Female ENDOSCOPIST: Hart Carwinora M Isabella Roemmich, MD REFERRED XB:JYNWGBY:Scott Gerda DissLuking, M.D. PROCEDURE DATE:  10/21/2013 PROCEDURE:   Colonoscopy, screening First Screening Colonoscopy - Avg.  risk and is 50 yrs.  old or older - No.  Prior Negative Screening - Now for repeat screening. 10 or more years since last screening  History of Adenoma - Now for follow-up colonoscopy & has been > or = to 3 yrs.  N/A  Polyps Removed Today? No.  Recommend repeat exam, <10 yrs? No. ASA CLASS:   Class I INDICATIONS:positive family history of colon cancer in maternal grandfather question of inflammatory bowel disease last colonoscopy in July 2005.  Patient remains asymptomatic. MEDICATIONS: MAC sedation, administered by CRNA and Propofol (Diprivan) 240 mg IV  DESCRIPTION OF PROCEDURE:   After the risks benefits and alternatives of the procedure were thoroughly explained, informed consent was obtained.  A digital rectal exam revealed no abnormalities of the rectum.   The LB PFC-H190 [id number]  endoscope was introduced through the anus and advanced to the cecum, which was identified by both the appendix and ileocecal valve. No adverse events experienced.   The quality of the prep was good, using MoviPrep  The instrument was then slowly withdrawn as the colon was fully examined.      COLON FINDINGS: A normal appearing cecum, ileocecal valve, and appendiceal orifice were identified.  The ascending, hepatic flexure, transverse, splenic flexure, descending, sigmoid colon and rectum appeared unremarkable.  No polyps or cancers were seen. Retroflexed views revealed no abnormalities. The time to cecum=7 minutes 01 seconds.  Withdrawal time=6 minutes 05 seconds.  The scope was withdrawn and the procedure completed. COMPLICATIONS: There were  no complications.  ENDOSCOPIC IMPRESSION: Normal colon ,torturous sigmoid colon  RECOMMENDATIONS: high fiber diet Recall colonoscopy in 10 years   eSigned:  Hart Carwinora M Leyani Gargus, MD 10/21/2013 8:33 AM   cc:

## 2013-10-21 NOTE — Progress Notes (Signed)
Report to PACU, RN, vss, BBS= Clear.  

## 2013-10-22 ENCOUNTER — Telehealth: Payer: Self-pay | Admitting: *Deleted

## 2013-10-22 NOTE — Telephone Encounter (Signed)
  Follow up Call-  Call back number 10/21/2013  Post procedure Call Back phone  # [phone]  Permission to leave phone message Yes     Patient questions:  Do you have a fever, pain , or abdominal swelling? No. Pain Score  0 *  Have you tolerated food without any problems? Yes.    Have you been able to return to your normal activities? Yes.    Do you have any questions about your discharge instructions: Diet   No. Medications  No. Follow up visit  No.  Do you have questions or concerns about your Care? No.  Actions: * If pain score is 4 or above: No action needed, pain <4.

## 2014-01-18 ENCOUNTER — Other Ambulatory Visit: Payer: Self-pay | Admitting: Family Medicine

## 2014-01-18 DIAGNOSIS — Z139 Encounter for screening, unspecified: Secondary | ICD-10-CM

## 2014-01-20 ENCOUNTER — Ambulatory Visit (HOSPITAL_COMMUNITY)
Admission: RE | Admit: 2014-01-20 | Discharge: 2014-01-20 | Disposition: A | Discharge status: Home / Self Care | Payer: BC Managed Care – PPO | Source: Ambulatory Visit | Attending: Family Medicine | Admitting: Family Medicine

## 2014-01-20 DIAGNOSIS — Z1231 Encounter for screening mammogram for malignant neoplasm of breast: Secondary | ICD-10-CM | POA: Diagnosis present

## 2014-01-20 DIAGNOSIS — Z139 Encounter for screening, unspecified: Secondary | ICD-10-CM

## 2014-08-23 ENCOUNTER — Telehealth: Payer: Self-pay | Admitting: Family Medicine

## 2014-08-23 NOTE — Telephone Encounter (Signed)
Error

## 2015-02-09 ENCOUNTER — Other Ambulatory Visit: Payer: Self-pay | Admitting: Family Medicine

## 2015-02-09 DIAGNOSIS — Z1231 Encounter for screening mammogram for malignant neoplasm of breast: Secondary | ICD-10-CM

## 2015-02-11 ENCOUNTER — Ambulatory Visit (HOSPITAL_COMMUNITY)
Admission: RE | Admit: 2015-02-11 | Discharge: 2015-02-11 | Disposition: A | Discharge status: Home / Self Care | Payer: BC Managed Care – PPO | Source: Ambulatory Visit | Attending: Family Medicine | Admitting: Family Medicine

## 2015-02-11 DIAGNOSIS — Z1231 Encounter for screening mammogram for malignant neoplasm of breast: Secondary | ICD-10-CM | POA: Insufficient documentation

## 2015-08-31 ENCOUNTER — Encounter: Payer: Self-pay | Admitting: Family Medicine

## 2015-08-31 ENCOUNTER — Ambulatory Visit (INDEPENDENT_AMBULATORY_CARE_PROVIDER_SITE_OTHER): Payer: BC Managed Care – PPO | Admitting: Family Medicine

## 2015-08-31 VITALS — Temp 98.9°F | Ht 62.0 in | Wt 169.8 lb

## 2015-08-31 DIAGNOSIS — B309 Viral conjunctivitis, unspecified: Secondary | ICD-10-CM | POA: Diagnosis not present

## 2015-08-31 DIAGNOSIS — J029 Acute pharyngitis, unspecified: Secondary | ICD-10-CM | POA: Diagnosis not present

## 2015-08-31 DIAGNOSIS — J040 Acute laryngitis: Secondary | ICD-10-CM

## 2015-08-31 DIAGNOSIS — J020 Streptococcal pharyngitis: Secondary | ICD-10-CM

## 2015-08-31 LAB — POCT RAPID STREP A (OFFICE): RAPID STREP A SCREEN: POSITIVE — AB

## 2015-08-31 MED ORDER — AZITHROMYCIN 250 MG PO TABS: ORAL_TABLET | ORAL | Status: DC

## 2015-08-31 NOTE — Progress Notes (Signed)
   Subjective:    Patient ID: Alison Adams, female    DOB: 04/29/1965, 50 y.o.   MRN: [id number]  Cough This is a new problem. The current episode started in the past 7 days. Associated symptoms include a fever, headaches and a sore throat. Associated symptoms comments: abd pain. Treatments tried: nyquil, dayquil.    Patient with hoarseness also left eye crusting sore throat past few days low-grade fever no nausea or vomiting has been exposed to strep she is a Runner, broadcasting/film/videoteacher PMH benign  Review of Systems  Constitutional: Positive for fever.  HENT: Positive for sore throat.   Respiratory: Positive for cough.   Neurological: Positive for headaches.       Objective:   Physical Exam  Throat shows mild erythema and no swelling. Neck small anterior adenopathy lungs are clear hearts regular mild conjunctivitis left eye      Assessment & Plan:  Conjunctivitis viral should gradually get better Laryngitis viral should gradually get better Strep throat antibiotic prescribed warning signs discussed follow-up if problems

## 2015-09-15 ENCOUNTER — Telehealth: Payer: Self-pay | Admitting: Family Medicine

## 2015-09-15 NOTE — Telephone Encounter (Signed)
Left message return call to get more information  

## 2015-09-15 NOTE — Telephone Encounter (Signed)
Pt was seen a couple weeks ago for strep and was given a zpac. Pt is not any better and wants to know if something else can be called in.      [facility] [place]

## 2015-09-16 MED ORDER — CEFPROZIL 500 MG PO TABS: ORAL_TABLET | ORAL | Status: DC

## 2015-09-16 NOTE — Telephone Encounter (Signed)
Spoke with patient to discuss patient's symptoms. Patient stated that she is still experiencing sore throat, low grade fever (99-100), cough and headache. Has been exposed at work by children recently that had strep.Please advise? Call patient back at [phone]

## 2015-09-16 NOTE — Telephone Encounter (Signed)
Medication sent into pharmacy per Dr.Steve Luking. Left message return call

## 2015-09-16 NOTE — Telephone Encounter (Signed)
cefzil 500 bid ten d for possiblr resist strep

## 2016-05-28 ENCOUNTER — Ambulatory Visit (INDEPENDENT_AMBULATORY_CARE_PROVIDER_SITE_OTHER): Payer: BC Managed Care – PPO | Admitting: Family Medicine

## 2016-05-28 ENCOUNTER — Encounter: Payer: Self-pay | Admitting: Family Medicine

## 2016-05-28 VITALS — BP 134/88 | Temp 99.0°F | Ht 62.0 in | Wt 173.1 lb

## 2016-05-28 DIAGNOSIS — J111 Influenza due to unidentified influenza virus with other respiratory manifestations: Secondary | ICD-10-CM

## 2016-05-28 MED ORDER — OSELTAMIVIR PHOSPHATE 75 MG PO CAPS: 75.0000 mg | ORAL_CAPSULE | Freq: Two times a day (BID) | ORAL | 0 refills | Status: DC

## 2016-05-28 NOTE — Progress Notes (Signed)
   Subjective:    Patient ID: Alison Adams, female    DOB: 05/05/1965, 51 y.o.   MRN: [id number]  Influenza  This is a new problem. The current episode started in the past 7 days. Associated symptoms include coughing, a fever, headaches, myalgias and a sore throat. Pertinent negatives include no nausea or vomiting. She has tried acetaminophen for the symptoms.   Patient states no other concerns this visit.  Started with sore thraota and headcjhe fver sat sun  Low energy Review of Systems  Constitutional: Positive for fever.  HENT: Positive for sore throat.   Respiratory: Positive for cough.   Gastrointestinal: Negative for diarrhea, nausea and vomiting.  Musculoskeletal: Positive for myalgias.  Neurological: Positive for headaches.       Objective:   Physical Exam  Constitutional: She appears well-developed.  HENT:  Head: Normocephalic.  Nose: Nose normal.  Mouth/Throat: Oropharynx is clear and moist. No oropharyngeal exudate.  Neck: Neck supple.  Cardiovascular: Normal rate and normal heart sounds.   No murmur heard. Pulmonary/Chest: Effort normal and breath sounds normal. She has no wheezes.  Lymphadenopathy:    She has no cervical adenopathy.  Skin: Skin is warm and dry.  Nursing note and vitals reviewed.    Patient not rest or distress     Assessment & Plan:  Influenza-the patient was diagnosed with influenza. Patient/family educated about the flu and warning signs to watch for. If difficulty breathing, severe neck pain and stiffness, cyanosis, disorientation, or progressive worsening then immediately get rechecked at that ER. If progressive symptoms be certain to be rechecked. Supportive measures such as Tylenol/ibuprofen was discussed. No aspirin use in children. And influenza home care instruction sheet was given. Tamiflu was prescribed Warning signs discussed in detail Work excuse for this week

## 2016-05-28 NOTE — Patient Instructions (Signed)

## 2016-07-20 ENCOUNTER — Encounter: Payer: Self-pay | Admitting: Family Medicine

## 2016-07-20 ENCOUNTER — Ambulatory Visit (INDEPENDENT_AMBULATORY_CARE_PROVIDER_SITE_OTHER): Payer: BC Managed Care – PPO | Admitting: Family Medicine

## 2016-07-20 VITALS — BP 160/104 | Temp 98.9°F | Ht 62.0 in | Wt 175.0 lb

## 2016-07-20 DIAGNOSIS — R809 Proteinuria, unspecified: Secondary | ICD-10-CM | POA: Diagnosis not present

## 2016-07-20 DIAGNOSIS — I10 Essential (primary) hypertension: Secondary | ICD-10-CM

## 2016-07-20 DIAGNOSIS — Z13220 Encounter for screening for lipoid disorders: Secondary | ICD-10-CM

## 2016-07-20 DIAGNOSIS — M2550 Pain in unspecified joint: Secondary | ICD-10-CM | POA: Diagnosis not present

## 2016-07-20 LAB — POCT URINALYSIS DIPSTICK
PH UA: 7 (ref 5.0–8.0)
Protein, UA: 30
Spec Grav, UA: 1.015 (ref 1.030–1.035)

## 2016-07-20 MED ORDER — AMLODIPINE BESYLATE 5 MG PO TABS
5.0000 mg | ORAL_TABLET | Freq: Every day | ORAL | 0 refills | Status: DC
Start: 2016-07-20 — End: 2016-07-23

## 2016-07-20 NOTE — Patient Instructions (Addendum)
Take 81 mg aspirin daily  If hands become blue or purple or severe pain please go to ER  Recheck here on Monday afternoon  Do your labs in the AM    DASH Eating Plan DASH stands for "Dietary Approaches to Stop Hypertension." The DASH eating plan is a healthy eating plan that has been shown to reduce high blood pressure (hypertension). It may also reduce your risk for type 2 diabetes, heart disease, and stroke. The DASH eating plan may also help with weight loss. What are tips for following this plan? General guidelines   Avoid eating more than 2,300 mg (milligrams) of salt (sodium) a day. If you have hypertension, you may need to reduce your sodium intake to 1,500 mg a day.  Limit alcohol intake to no more than 1 drink a day for nonpregnant women and 2 drinks a day for men. One drink equals 12 oz of beer, 5 oz of wine, or 1 oz of hard liquor.  Work with your health care provider to maintain a healthy body weight or to lose weight. Ask what an ideal weight is for you.  Get at least 30 minutes of exercise that causes your heart to beat faster (aerobic exercise) most days of the week. Activities may include walking, swimming, or biking.  Work with your health care provider or diet and nutrition specialist (dietitian) to adjust your eating plan to your individual calorie needs. Reading food labels   Check food labels for the amount of sodium per serving. Choose foods with less than 5 percent of the Daily Value of sodium. Generally, foods with less than 300 mg of sodium per serving fit into this eating plan.  To find whole grains, look for the word "whole" as the first word in the ingredient list. Shopping   Buy products labeled as "low-sodium" or "no salt added."  Buy fresh foods. Avoid canned foods and premade or frozen meals. Cooking   Avoid adding salt when cooking. Use salt-free seasonings or herbs instead of table salt or sea salt. Check with your health care provider or  pharmacist before using salt substitutes.  Do not fry foods. Cook foods using healthy methods such as baking, boiling, grilling, and broiling instead.  Cook with heart-healthy oils, such as olive, canola, soybean, or sunflower oil. Meal planning    Eat a balanced diet that includes:  5 or more servings of fruits and vegetables each day. At each meal, try to fill half of your plate with fruits and vegetables.  Up to 6-8 servings of whole grains each day.  Less than 6 oz of lean meat, poultry, or fish each day. A 3-oz serving of meat is about the same size as a deck of cards. One egg equals 1 oz.  2 servings of low-fat dairy each day.  A serving of nuts, seeds, or beans 5 times each week.  Heart-healthy fats. Healthy fats called Omega-3 fatty acids are found in foods such as flaxseeds and coldwater fish, like sardines, salmon, and mackerel.  Limit how much you eat of the following:  Canned or prepackaged foods.  Food that is high in trans fat, such as fried foods.  Food that is high in saturated fat, such as fatty meat.  Sweets, desserts, sugary drinks, and other foods with added sugar.  Full-fat dairy products.  Do not salt foods before eating.  Try to eat at least 2 vegetarian meals each week.  Eat more home-cooked food and less restaurant, buffet, and fast  food.  When eating at a restaurant, ask that your food be prepared with less salt or no salt, if possible. What foods are recommended? The items listed may not be a complete list. Talk with your dietitian about what dietary choices are best for you. Grains  Whole-grain or whole-wheat bread. Whole-grain or whole-wheat pasta. Brown rice. Orpah Cobb. Bulgur. Whole-grain and low-sodium cereals. Pita bread. Low-fat, low-sodium crackers. Whole-wheat flour tortillas. Vegetables  Fresh or frozen vegetables (raw, steamed, roasted, or grilled). Low-sodium or reduced-sodium tomato and vegetable juice. Low-sodium or  reduced-sodium tomato sauce and tomato paste. Low-sodium or reduced-sodium canned vegetables. Fruits  All fresh, dried, or frozen fruit. Canned fruit in natural juice (without added sugar). Meat and other protein foods  Skinless chicken or [place]. Ground chicken or [place]. Pork with fat trimmed off. Fish and seafood. Egg whites. Dried beans, peas, or lentils. Unsalted nuts, nut butters, and seeds. Unsalted canned beans. Lean cuts of beef with fat trimmed off. Low-sodium, lean deli meat. Dairy  Low-fat (1%) or fat-free (skim) milk. Fat-free, low-fat, or reduced-fat cheeses. Nonfat, low-sodium ricotta or cottage cheese. Low-fat or nonfat yogurt. Low-fat, low-sodium cheese. Fats and oils  Soft margarine without trans fats. Vegetable oil. Low-fat, reduced-fat, or light mayonnaise and salad dressings (reduced-sodium). Canola, safflower, olive, soybean, and sunflower oils. Avocado. Seasoning and other foods  Herbs. Spices. Seasoning mixes without salt. Unsalted popcorn and pretzels. Fat-free sweets. What foods are not recommended? The items listed may not be a complete list. Talk with your dietitian about what dietary choices are best for you. Grains  Baked goods made with fat, such as croissants, muffins, or some breads. Dry pasta or rice meal packs. Vegetables  Creamed or fried vegetables. Vegetables in a cheese sauce. Regular canned vegetables (not low-sodium or reduced-sodium). Regular canned tomato sauce and paste (not low-sodium or reduced-sodium). Regular tomato and vegetable juice (not low-sodium or reduced-sodium). Rosita Fire. Olives. Fruits  Canned fruit in a light or heavy syrup. Fried fruit. Fruit in cream or butter sauce. Meat and other protein foods  Fatty cuts of meat. Ribs. Fried meat. Tomasa Blase. Sausage. Bologna and other processed lunch meats. Salami. Fatback. Hotdogs. Bratwurst. Salted nuts and seeds. Canned beans with added salt. Canned or smoked fish. Whole eggs or egg yolks. Chicken or  [place] with skin. Dairy  Whole or 2% milk, cream, and half-and-half. Whole or full-fat cream cheese. Whole-fat or sweetened yogurt. Full-fat cheese. Nondairy creamers. Whipped toppings. Processed cheese and cheese spreads. Fats and oils  Butter. Stick margarine. Lard. Shortening. Ghee. Bacon fat. Tropical oils, such as coconut, palm kernel, or palm oil. Seasoning and other foods  Salted popcorn and pretzels. Onion salt, garlic salt, seasoned salt, table salt, and sea salt. Worcestershire sauce. Tartar sauce. Barbecue sauce. Teriyaki sauce. Soy sauce, including reduced-sodium. Steak sauce. Canned and packaged gravies. Fish sauce. Oyster sauce. Cocktail sauce. Horseradish that you find on the shelf. Ketchup. Mustard. Meat flavorings and tenderizers. Bouillon cubes. Hot sauce and Tabasco sauce. Premade or packaged marinades. Premade or packaged taco seasonings. Relishes. Regular salad dressings. Where to find more information:  National Heart, Lung, and Blood Institute: PopSteam.is  American Heart Association: www.heart.org Summary  The DASH eating plan is a healthy eating plan that has been shown to reduce high blood pressure (hypertension). It may also reduce your risk for type 2 diabetes, heart disease, and stroke.  With the DASH eating plan, you should limit salt (sodium) intake to 2,300 mg a day. If you have hypertension, you may need to  reduce your sodium intake to 1,500 mg a day.  When on the DASH eating plan, aim to eat more fresh fruits and vegetables, whole grains, lean proteins, low-fat dairy, and heart-healthy fats.  Work with your health care provider or diet and nutrition specialist (dietitian) to adjust your eating plan to your individual calorie needs. This information is not intended to replace advice given to you by your health care provider. Make sure you discuss any questions you have with your health care provider. Document Released: 03/22/2011 Document Revised:  03/26/2016 Document Reviewed: 03/26/2016 Elsevier Interactive Patient Education  2017 [employer]

## 2016-07-20 NOTE — Progress Notes (Addendum)
   Subjective:    Patient ID: Alison Adams, female    DOB: 23-Mar-1966, 51 y.o.   MRN: [id number]  Hypertension  This is a new problem.  hands and feet swollen. Hands are Dusky appearing on the back of her hands even having a slight bluish appearance, headache, BP today 166/109. She states her blood pressure has been up today. She's not quite sure what it has been before this. She did not check it until she felt bad with mild headache. Denies any wheezing difficulty breathing chest tightness pressure pain. She is here today with her husband. She states her urination has been fine. Denies discoloration in other parts of the skin. Denies excessive swelling states the hands and feet have appeared puffy. Very itchy skin all other for 2 weeks.   No family history of circulation issues distant family history of high blood pressure no family history of connective tissue diseases Patient denies having anything like this before.  Review of Systems Patient denies any chest pain denies wheezing shortness of breath is having mild headache denies arm pain and hand pain denies numbness states hands and feet seem puffy. Denies joint pain.    Objective:   Physical Exam On physical exam the palms are pink the back of the fingers have a light dusky color I would not characterize this as true cyanosis radial pulses strong when occluding both radial and ulnar pulse the palms do get blood flow through the ulnar artery when decompressed after clinching fist multiple times Strong radial pulse. There is no cyanosis in the feet blood pressure elevated on multiple attempts 150/96 urinalysis with some protein No jaundice O2 sats ration 98% I do not see any appreciable swelling.   it should be noted that there is no petechiae. No rash noted. No skin ulceration , hand temp normal, pt reports no numbness or pain Assessment & Plan:  Proteinuria check urine ACR Hypertension start amlodipine 5 mg this should help blood  pressure as well as any Raynaud like symptoms. Family was warned and shown pictures that if the hand start becoming more deep cyanotic appearance immediately go to ER 81 mg aspirin recommended daily I doubt vasculitis but we will check some lab work await the results May need referral based upon how this plays out  I did warn the patient specifically that if this condition worsens over the weekend to go to the ER. Await the results of the lab work. Also the patient will follow-up within a couple days and we will recheck blood pressure recheck the condition

## 2016-07-21 LAB — MICROALBUMIN / CREATININE URINE RATIO
CREATININE, UR: 160.7 mg/dL
MICROALBUM., U, RANDOM: 5 ug/mL
Microalb/Creat Ratio: 3.1 mg/g creat (ref 0.0–30.0)

## 2016-07-21 LAB — PLEASE NOTE

## 2016-07-23 ENCOUNTER — Encounter: Payer: Self-pay | Admitting: Family Medicine

## 2016-07-23 ENCOUNTER — Ambulatory Visit (INDEPENDENT_AMBULATORY_CARE_PROVIDER_SITE_OTHER): Payer: BC Managed Care – PPO | Admitting: Family Medicine

## 2016-07-23 VITALS — BP 128/78 | Ht 62.0 in | Wt 172.1 lb

## 2016-07-23 DIAGNOSIS — E784 Other hyperlipidemia: Secondary | ICD-10-CM

## 2016-07-23 DIAGNOSIS — E7849 Other hyperlipidemia: Secondary | ICD-10-CM | POA: Insufficient documentation

## 2016-07-23 DIAGNOSIS — I10 Essential (primary) hypertension: Secondary | ICD-10-CM

## 2016-07-23 LAB — CBC WITH DIFFERENTIAL/PLATELET
Basophils Absolute: 0.1 10*3/uL (ref 0.0–0.2)
Basos: 2 %
EOS (ABSOLUTE): 0.2 10*3/uL (ref 0.0–0.4)
EOS: 3 %
Hematocrit: 39.8 % (ref 34.0–46.6)
Hemoglobin: 13.1 g/dL (ref 11.1–15.9)
Immature Grans (Abs): 0 10*3/uL (ref 0.0–0.1)
Immature Granulocytes: 0 %
LYMPHS ABS: 1.8 10*3/uL (ref 0.7–3.1)
Lymphs: 26 %
MCH: 28.1 pg (ref 26.6–33.0)
MCHC: 32.9 g/dL (ref 31.5–35.7)
MCV: 85 fL (ref 79–97)
MONOCYTES: 7 %
MONOS ABS: 0.5 10*3/uL (ref 0.1–0.9)
NEUTROS ABS: 4.3 10*3/uL (ref 1.4–7.0)
Neutrophils: 62 %
PLATELETS: 333 10*3/uL (ref 150–379)
RBC: 4.67 x10E6/uL (ref 3.77–5.28)
RDW: 16.6 % — AB (ref 12.3–15.4)
WBC: 6.9 10*3/uL (ref 3.4–10.8)

## 2016-07-23 LAB — BASIC METABOLIC PANEL
BUN/Creatinine Ratio: 18 (ref 9–23)
BUN: 12 mg/dL (ref 6–24)
CALCIUM: 9.5 mg/dL (ref 8.7–10.2)
CO2: 25 mmol/L (ref 18–29)
CREATININE: 0.67 mg/dL (ref 0.57–1.00)
Chloride: 104 mmol/L (ref 96–106)
GFR calc Af Amer: 118 mL/min/{1.73_m2} (ref >59)
GFR, EST NON AFRICAN AMERICAN: 102 mL/min/{1.73_m2} (ref >59)
Glucose: 97 mg/dL (ref 65–99)
Potassium: 4.7 mmol/L (ref 3.5–5.2)
Sodium: 143 mmol/L (ref 134–144)

## 2016-07-23 LAB — LIPID PANEL
Chol/HDL Ratio: 3.8 ratio (ref 0.0–4.4)
Cholesterol, Total: 209 mg/dL — ABNORMAL HIGH (ref 100–199)
HDL: 55 mg/dL (ref >39)
LDL Calculated: 137 mg/dL — ABNORMAL HIGH (ref 0–99)
TRIGLYCERIDES: 85 mg/dL (ref 0–149)
VLDL CHOLESTEROL CAL: 17 mg/dL (ref 5–40)

## 2016-07-23 LAB — C-REACTIVE PROTEIN: CRP: 1.5 mg/L (ref 0.0–4.9)

## 2016-07-23 LAB — SEDIMENTATION RATE: SED RATE: 7 mm/h (ref 0–40)

## 2016-07-23 MED ORDER — AMLODIPINE BESYLATE 5 MG PO TABS: 5.0000 mg | ORAL_TABLET | Freq: Every day | ORAL | 5 refills | Status: DC

## 2016-07-23 NOTE — Progress Notes (Signed)
   Subjective:    Patient ID: Alison Adams, female    DOB: 05-29-1965, 51 y.o.   MRN: [id number]  Hypertension  This is a new problem. The current episode started in the past 7 days.  Patient denies any headaches chills abdominal pain fevers relates the rash on her hands with the dusky color has now gone away she denies fever chills sweats Patient states no other concerns this visit.   Review of Systems See above please    Objective:   Physical Exam Lungs clear heart regular pulse normal BP is good with large cuff       Assessment & Plan:  Mild obesity patient encouraged to lose 20 pounds over the next 6 months watch diet exercise more often  HTN continue medication follow-up if progressive troubles or worse  Follow-up for courtesy blood pressure checked within the next 2 weeks  Follow-up office visit within 6 months  Mild cholesterol issues watch fats in the diet as best as possible

## 2016-08-23 ENCOUNTER — Encounter: Payer: Self-pay | Admitting: Family Medicine

## 2016-08-23 ENCOUNTER — Ambulatory Visit (INDEPENDENT_AMBULATORY_CARE_PROVIDER_SITE_OTHER): Payer: BC Managed Care – PPO | Admitting: Family Medicine

## 2016-08-23 VITALS — BP 124/72 | Ht 62.0 in | Wt 172.0 lb

## 2016-08-23 DIAGNOSIS — I10 Essential (primary) hypertension: Secondary | ICD-10-CM

## 2016-08-23 MED ORDER — LISINOPRIL 2.5 MG PO TABS: 2.5000 mg | ORAL_TABLET | Freq: Every day | ORAL | 5 refills | Status: DC

## 2016-08-23 NOTE — Progress Notes (Signed)
   Subjective:    Patient ID: Alison BurdockDeena C Cornman, female    DOB: 11/24/1965, 51 y.o.   MRN: [id number]  Hypertension  This is a new problem. Associated symptoms include peripheral edema. Treatments tried: amlodipine.  Patient denies any chest tightness pressure pain shortness breath sweats chills fevers she is noticed that some having some swelling in her feet    Review of Systems Denies any other particular troubles    Objective:   Physical Exam  Lungs clear heart regular slight amount of swelling in the feet      Assessment & Plan:  Patient has peripheral edema associated with the amlodipine DC amlodipine Lisinopril 2.5 mg one daily Follow-up within 6 weeks to recheck blood pressure Watch diet exercise try to lose weight

## 2016-10-04 ENCOUNTER — Ambulatory Visit (INDEPENDENT_AMBULATORY_CARE_PROVIDER_SITE_OTHER): Payer: BC Managed Care – PPO | Admitting: Family Medicine

## 2016-10-04 ENCOUNTER — Encounter: Payer: Self-pay | Admitting: Family Medicine

## 2016-10-04 VITALS — BP 122/82 | Ht 62.0 in | Wt 170.4 lb

## 2016-10-04 DIAGNOSIS — I10 Essential (primary) hypertension: Secondary | ICD-10-CM | POA: Diagnosis not present

## 2016-10-04 MED ORDER — LISINOPRIL 2.5 MG PO TABS: 2.5000 mg | ORAL_TABLET | Freq: Every day | ORAL | 2 refills | Status: DC

## 2016-10-04 NOTE — Progress Notes (Signed)
   Subjective:    Patient ID: Alison BurdockDeena C Daponte, female    DOB: 01/07/1966, 51 y.o.   MRN: [id number]  Hypertension  This is a chronic problem. The current episode started more than 1 year ago. Pertinent negatives include no chest pain, headaches or shortness of breath. Treatments tried: lisinopril. There are no compliance problems.    Patient is using the current dose and medicine. No longer using Norvasc. Swelling in the feet have gone away.  She watches how she eats to some degree not exercising quite as much as she should   Review of Systems  Constitutional: Negative for activity change, fatigue and fever.  Respiratory: Negative for cough and shortness of breath.   Cardiovascular: Negative for chest pain and leg swelling.  Neurological: Negative for headaches.       Objective:   Physical Exam  Constitutional: She appears well-nourished. No distress.  Cardiovascular: Normal rate, regular rhythm and normal heart sounds.   No murmur heard. Pulmonary/Chest: Effort normal and breath sounds normal. No respiratory distress.  Musculoskeletal: She exhibits no edema.  Lymphadenopathy:    She has no cervical adenopathy.  Neurological: She is alert. She exhibits normal muscle tone.  Psychiatric: Her behavior is normal.  Vitals reviewed.         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

## 2016-10-04 NOTE — Patient Instructions (Addendum)
DASH Eating Plan DASH stands for "Dietary Approaches to Stop Hypertension." The DASH eating plan is a healthy eating plan that has been shown to reduce high blood pressure (hypertension). It may also reduce your risk for type 2 diabetes, heart disease, and stroke. The DASH eating plan may also help with weight loss. What are tips for following this plan? General guidelines  Avoid eating more than 2,300 mg (milligrams) of salt (sodium) a day. If you have hypertension, you may need to reduce your sodium intake to 1,500 mg a day.  Limit alcohol intake to no more than 1 drink a day for nonpregnant women and 2 drinks a day for men. One drink equals 12 oz of beer, 5 oz of wine, or 1 oz of hard liquor.  Work with your health care provider to maintain a healthy body weight or to lose weight. Ask what an ideal weight is for you.  Get at least 30 minutes of exercise that causes your heart to beat faster (aerobic exercise) most days of the week. Activities may include walking, swimming, or biking.  Work with your health care provider or diet and nutrition specialist (dietitian) to adjust your eating plan to your individual calorie needs. Reading food labels  Check food labels for the amount of sodium per serving. Choose foods with less than 5 percent of the Daily Value of sodium. Generally, foods with less than 300 mg of sodium per serving fit into this eating plan.  To find whole grains, look for the word "whole" as the first word in the ingredient list. Shopping  Buy products labeled as "low-sodium" or "no salt added."  Buy fresh foods. Avoid canned foods and premade or frozen meals. Cooking  Avoid adding salt when cooking. Use salt-free seasonings or herbs instead of table salt or sea salt. Check with your health care provider or pharmacist before using salt substitutes.  Do not fry foods. Cook foods using healthy methods such as baking, boiling, grilling, and broiling instead.  Cook with  heart-healthy oils, such as olive, canola, soybean, or sunflower oil. Meal planning   Eat a balanced diet that includes: ? 5 or more servings of fruits and vegetables each day. At each meal, try to fill half of your plate with fruits and vegetables. ? Up to 6-8 servings of whole grains each day. ? Less than 6 oz of lean meat, poultry, or fish each day. A 3-oz serving of meat is about the same size as a deck of cards. One egg equals 1 oz. ? 2 servings of low-fat dairy each day. ? A serving of nuts, seeds, or beans 5 times each week. ? Heart-healthy fats. Healthy fats called Omega-3 fatty acids are found in foods such as flaxseeds and coldwater fish, like sardines, salmon, and mackerel.  Limit how much you eat of the following: ? Canned or prepackaged foods. ? Food that is high in trans fat, such as fried foods. ? Food that is high in saturated fat, such as fatty meat. ? Sweets, desserts, sugary drinks, and other foods with added sugar. ? Full-fat dairy products.  Do not salt foods before eating.  Try to eat at least 2 vegetarian meals each week.  Eat more home-cooked food and less restaurant, buffet, and fast food.  When eating at a restaurant, ask that your food be prepared with less salt or no salt, if possible. What foods are recommended? The items listed may not be a complete list. Talk with your dietitian about what   dietary choices are best for you. Grains Whole-grain or whole-wheat bread. Whole-grain or whole-wheat pasta. Brown rice. Oatmeal. Quinoa. Bulgur. Whole-grain and low-sodium cereals. Pita bread. Low-fat, low-sodium crackers. Whole-wheat flour tortillas. Vegetables Fresh or frozen vegetables (raw, steamed, roasted, or grilled). Low-sodium or reduced-sodium tomato and vegetable juice. Low-sodium or reduced-sodium tomato sauce and tomato paste. Low-sodium or reduced-sodium canned vegetables. Fruits All fresh, dried, or frozen fruit. Canned fruit in natural juice (without  added sugar). Meat and other protein foods Skinless chicken or turkey. Ground chicken or turkey. Pork with fat trimmed off. Fish and seafood. Egg whites. Dried beans, peas, or lentils. Unsalted nuts, nut butters, and seeds. Unsalted canned beans. Lean cuts of beef with fat trimmed off. Low-sodium, lean deli meat. Dairy Low-fat (1%) or fat-free (skim) milk. Fat-free, low-fat, or reduced-fat cheeses. Nonfat, low-sodium ricotta or cottage cheese. Low-fat or nonfat yogurt. Low-fat, low-sodium cheese. Fats and oils Soft margarine without trans fats. Vegetable oil. Low-fat, reduced-fat, or light mayonnaise and salad dressings (reduced-sodium). Canola, safflower, olive, soybean, and sunflower oils. Avocado. Seasoning and other foods Herbs. Spices. Seasoning mixes without salt. Unsalted popcorn and pretzels. Fat-free sweets. What foods are not recommended? The items listed may not be a complete list. Talk with your dietitian about what dietary choices are best for you. Grains Baked goods made with fat, such as croissants, muffins, or some breads. Dry pasta or rice meal packs. Vegetables Creamed or fried vegetables. Vegetables in a cheese sauce. Regular canned vegetables (not low-sodium or reduced-sodium). Regular canned tomato sauce and paste (not low-sodium or reduced-sodium). Regular tomato and vegetable juice (not low-sodium or reduced-sodium). Pickles. Olives. Fruits Canned fruit in a light or heavy syrup. Fried fruit. Fruit in cream or butter sauce. Meat and other protein foods Fatty cuts of meat. Ribs. Fried meat. Bacon. Sausage. Bologna and other processed lunch meats. Salami. Fatback. Hotdogs. Bratwurst. Salted nuts and seeds. Canned beans with added salt. Canned or smoked fish. Whole eggs or egg yolks. Chicken or turkey with skin. Dairy Whole or 2% milk, cream, and half-and-half. Whole or full-fat cream cheese. Whole-fat or sweetened yogurt. Full-fat cheese. Nondairy creamers. Whipped toppings.  Processed cheese and cheese spreads. Fats and oils Butter. Stick margarine. Lard. Shortening. Ghee. Bacon fat. Tropical oils, such as coconut, palm kernel, or palm oil. Seasoning and other foods Salted popcorn and pretzels. Onion salt, garlic salt, seasoned salt, table salt, and sea salt. Worcestershire sauce. Tartar sauce. Barbecue sauce. Teriyaki sauce. Soy sauce, including reduced-sodium. Steak sauce. Canned and packaged gravies. Fish sauce. Oyster sauce. Cocktail sauce. Horseradish that you find on the shelf. Ketchup. Mustard. Meat flavorings and tenderizers. Bouillon cubes. Hot sauce and Tabasco sauce. Premade or packaged marinades. Premade or packaged taco seasonings. Relishes. Regular salad dressings. Where to find more information:  National Heart, Lung, and Blood Institute: www.nhlbi.nih.gov  American Heart Association: www.heart.org Summary  The DASH eating plan is a healthy eating plan that has been shown to reduce high blood pressure (hypertension). It may also reduce your risk for type 2 diabetes, heart disease, and stroke.  With the DASH eating plan, you should limit salt (sodium) intake to 2,300 mg a day. If you have hypertension, you may need to reduce your sodium intake to 1,500 mg a day.  When on the DASH eating plan, aim to eat more fresh fruits and vegetables, whole grains, lean proteins, low-fat dairy, and heart-healthy fats.  Work with your health care provider or diet and nutrition specialist (dietitian) to adjust your eating plan to your individual   calorie needs. This information is not intended to replace advice given to you by your health care provider. Make sure you discuss any questions you have with your health care provider. Document Released: 03/22/2011 Document Revised: 03/26/2016 Document Reviewed: 03/26/2016 Elsevier Interactive Patient Education  2017 Elsevier Inc.  Back Exercises The following exercises strengthen the muscles that help to support the back.  They also help to keep the lower back flexible. Doing these exercises can help to prevent back pain or lessen existing pain. If you have back pain or discomfort, try doing these exercises 2-3 times each day or as told by your health care provider. When the pain goes away, do them once each day, but increase the number of times that you repeat the steps for each exercise (do more repetitions). If you do not have back pain or discomfort, do these exercises once each day or as told by your health care provider. Exercises Single Knee to Chest  Repeat these steps 3-5 times for each leg: 1. Lie on your back on a firm bed or the floor with your legs extended. 2. Bring one knee to your chest. Your other leg should stay extended and in contact with the floor. 3. Hold your knee in place by grabbing your knee or thigh. 4. Pull on your knee until you feel a gentle stretch in your lower back. 5. Hold the stretch for 10-30 seconds. 6. Slowly release and straighten your leg.  Pelvic Tilt  Repeat these steps 5-10 times: 1. Lie on your back on a firm bed or the floor with your legs extended. 2. Bend your knees so they are pointing toward the ceiling and your feet are flat on the floor. 3. Tighten your lower abdominal muscles to press your lower back against the floor. This motion will tilt your pelvis so your tailbone points up toward the ceiling instead of pointing to your feet or the floor. 4. With gentle tension and even breathing, hold this position for 5-10 seconds.  Cat-Cow  Repeat these steps until your lower back becomes more flexible: 1. Get into a hands-and-knees position on a firm surface. Keep your hands under your shoulders, and keep your knees under your hips. You may place padding under your knees for comfort. 2. Let your head hang down, and point your tailbone toward the floor so your lower back becomes rounded like the back of a cat. 3. Hold this position for 5 seconds. 4. Slowly lift your  head and point your tailbone up toward the ceiling so your back forms a sagging arch like the back of a cow. 5. Hold this position for 5 seconds.  Press-Ups  Repeat these steps 5-10 times: 1. Lie on your abdomen (face-down) on the floor. 2. Place your palms near your head, about shoulder-width apart. 3. While you keep your back as relaxed as possible and keep your hips on the floor, slowly straighten your arms to raise the top half of your body and lift your shoulders. Do not use your back muscles to raise your upper torso. You may adjust the placement of your hands to make yourself more comfortable. 4. Hold this position for 5 seconds while you keep your back relaxed. 5. Slowly return to lying flat on the floor.  Bridges  Repeat these steps 10 times: 1. Lie on your back on a firm surface. 2. Bend your knees so they are pointing toward the ceiling and your feet are flat on the floor. 3. Tighten your buttocks  muscles and lift your buttocks off of the floor until your waist is at almost the same height as your knees. You should feel the muscles working in your buttocks and the back of your thighs. If you do not feel these muscles, slide your feet 1-2 inches farther away from your buttocks. 4. Hold this position for 3-5 seconds. 5. Slowly lower your hips to the starting position, and allow your buttocks muscles to relax completely.  If this exercise is too easy, try doing it with your arms crossed over your chest. Abdominal Crunches  Repeat these steps 5-10 times: 1. Lie on your back on a firm bed or the floor with your legs extended. 2. Bend your knees so they are pointing toward the ceiling and your feet are flat on the floor. 3. Cross your arms over your chest. 4. Tip your chin slightly toward your chest without bending your neck. 5. Tighten your abdominal muscles and slowly raise your trunk (torso) high enough to lift your shoulder blades a tiny bit off of the floor. Avoid raising your  torso higher than that, because it can put too much stress on your low back and it does not help to strengthen your abdominal muscles. 6. Slowly return to your starting position.  Back Lifts Repeat these steps 5-10 times: 1. Lie on your abdomen (face-down) with your arms at your sides, and rest your forehead on the floor. 2. Tighten the muscles in your legs and your buttocks. 3. Slowly lift your chest off of the floor while you keep your hips pressed to the floor. Keep the back of your head in line with the curve in your back. Your eyes should be looking at the floor. 4. Hold this position for 3-5 seconds. 5. Slowly return to your starting position.  Contact a health care provider if:  Your back pain or discomfort gets much worse when you do an exercise.  Your back pain or discomfort does not lessen within 2 hours after you exercise. If you have any of these problems, stop doing these exercises right away. Do not do them again unless your health care provider says that you can. Get help right away if:  You develop sudden, severe back pain. If this happens, stop doing the exercises right away. Do not do them again unless your health care provider says that you can. This information is not intended to replace advice given to you by your health care provider. Make sure you discuss any questions you have with your health care provider. Document Released: 05/10/2004 Document Revised: 08/10/2015 Document Reviewed: 05/27/2014 Elsevier Interactive Patient Education  2017 [employer]

## 2017-05-17 ENCOUNTER — Ambulatory Visit: Payer: BC Managed Care – PPO | Admitting: Family Medicine

## 2017-05-17 ENCOUNTER — Encounter: Payer: Self-pay | Admitting: Family Medicine

## 2017-05-17 VITALS — BP 128/82 | Ht 62.0 in | Wt 166.0 lb

## 2017-05-17 DIAGNOSIS — L982 Febrile neutrophilic dermatosis [Sweet]: Secondary | ICD-10-CM

## 2017-05-17 DIAGNOSIS — I10 Essential (primary) hypertension: Secondary | ICD-10-CM | POA: Diagnosis not present

## 2017-05-17 DIAGNOSIS — E7849 Other hyperlipidemia: Secondary | ICD-10-CM

## 2017-05-17 DIAGNOSIS — K219 Gastro-esophageal reflux disease without esophagitis: Secondary | ICD-10-CM

## 2017-05-17 MED ORDER — PANTOPRAZOLE SODIUM 40 MG PO TBEC: 40.0000 mg | DELAYED_RELEASE_TABLET | Freq: Every day | ORAL | 2 refills | Status: DC

## 2017-05-17 MED ORDER — LISINOPRIL 2.5 MG PO TABS: 2.5000 mg | ORAL_TABLET | Freq: Every day | ORAL | 2 refills | Status: DC

## 2017-05-17 NOTE — Progress Notes (Signed)
   Subjective:    Patient ID: Alison Adams, female    DOB: 04/26/1965, 52 y.o.   MRN: [id number]  Hypertension  This is a chronic problem. The current episode started more than 1 year ago. Pertinent negatives include no chest pain, headaches or shortness of breath. Risk factors for coronary artery disease include post-menopausal state. Treatments tried: lisinopril. There are no compliance problems.    Patient for blood pressure check up. Patient relates compliance with meds. Todays BP reviewed with the patient. Patient denies issues with medication. Patient relates reasonable diet. Patient tries to minimize salt. Patient aware of BP goals.    Review of Systems  Constitutional: Negative for activity change, fatigue and fever.  HENT: Negative for congestion.   Respiratory: Negative for cough, chest tightness and shortness of breath.   Cardiovascular: Negative for chest pain and leg swelling.  Gastrointestinal: Negative for abdominal pain.  Skin: Negative for color change.  Neurological: Negative for headaches.  Psychiatric/Behavioral: Negative for behavioral problems.       Objective:   Physical Exam  Constitutional: She appears well-developed and well-nourished. No distress.  HENT:  Head: Normocephalic and atraumatic.  Eyes: Right eye exhibits no discharge. Left eye exhibits no discharge.  Neck: No tracheal deviation present.  Cardiovascular: Normal rate, regular rhythm and normal heart sounds.  No murmur heard. Pulmonary/Chest: Effort normal and breath sounds normal. No respiratory distress. She has no wheezes. She has no rales.  Musculoskeletal: She exhibits no edema.  Lymphadenopathy:    She has no cervical adenopathy.  Neurological: She is alert. She exhibits normal muscle tone.  Skin: Skin is warm and dry. No erythema.  Psychiatric: Her behavior is normal.  Vitals reviewed.         Assessment & Plan:  HTN- Patient was seen today as part of a visit regarding  hypertension. The importance of healthy diet and regular physical activity was discussed. The importance of compliance with medications discussed. Ideal goal is to keep blood pressure low elevated levels certainly below 140/90 when possible. The patient was counseled that keeping blood pressure under control lessen his risk of heart attack, stroke, kidney failure, and early death. The importance of regular follow-ups was discussed with the patient. Low-salt diet such as DASH recommended. Regular physical activity was recommended as well. Patient was advised to keep regular follow-ups.  Reflux under good control as long she takes her medicine on a regular basis  Hyperlipidemia patient tries to watch the fats in her diet follow-up lab work by June  Check metabolic 7 by June  Follow-up within 6-9 months

## 2017-07-04 ENCOUNTER — Encounter: Payer: Self-pay | Admitting: Family Medicine

## 2017-07-04 ENCOUNTER — Ambulatory Visit: Payer: BC Managed Care – PPO | Admitting: Family Medicine

## 2017-07-04 VITALS — BP 138/90 | Temp 98.6°F | Ht 62.0 in | Wt 167.0 lb

## 2017-07-04 DIAGNOSIS — J329 Chronic sinusitis, unspecified: Secondary | ICD-10-CM

## 2017-07-04 MED ORDER — AMOXICILLIN 500 MG PO CAPS: 500.0000 mg | ORAL_CAPSULE | Freq: Three times a day (TID) | ORAL | 0 refills | Status: DC

## 2017-07-04 NOTE — Progress Notes (Signed)
   Subjective:    Patient ID: Alison BurdockDeena C Troy, female    DOB: 06/22/1965, 52 y.o.   MRN: [id number]  HPI  Patient is here today with complaints of a headache,sore throat,achey,stuffy nose,cough,sneezing,post nasal drip,diarrhea all on going since Monday.She has been taking day and night Quil,which helps.  Had sig sore throat earleri on  Sneezing nd watry eyes and nose dripping   Stuff y and coughing   Diarrhea   No fever    pesistent not partic painful  Dull and persisten t     Pos achey thru out     Pt has had both sin  And bronchial infxn  Back aching    Hurts right lowr lumb back week or so   Leg back pan hurting  ''  apetite dim   ebergy leverl ow  ;  Review of Systems No headache, no major weight loss or weight gain, no chest pain no back pain abdominal pain no change in bowel habits complete ROS otherwise negative     Objective:   Physical Exam Alert, mild malaise. Hydration good Vitals stable. frontal/ maxillary tenderness evident positive nasal congestion. pharynx normal neck supple  lungs clear/no crackles or wheezes. heart regular in rhythm        Assessment & Plan:  Impression rhinosinusitis likely post viral, discussed with patient. plan antibiotics prescribed. Questions answered. Symptomatic care discussed. warning signs discussed. WSL  plus e musculoskeletal back pain symptom care discussed

## 2017-07-12 ENCOUNTER — Telehealth: Payer: Self-pay | Admitting: Family Medicine

## 2017-07-12 ENCOUNTER — Other Ambulatory Visit: Payer: Self-pay | Admitting: Nurse Practitioner

## 2017-07-12 MED ORDER — FLUCONAZOLE 150 MG PO TABS
ORAL_TABLET | ORAL | 0 refills | Status: DC
Start: 2017-07-12 — End: 2018-09-10

## 2017-07-12 NOTE — Telephone Encounter (Signed)
Done

## 2017-07-12 NOTE — Telephone Encounter (Signed)
Patient has developed a yeast infection due to being on an antibiotic.  She would like something called in.  [facility] [place]

## 2018-02-18 ENCOUNTER — Other Ambulatory Visit: Payer: Self-pay | Admitting: Family Medicine

## 2018-08-19 ENCOUNTER — Other Ambulatory Visit: Payer: Self-pay | Admitting: Family Medicine

## 2018-08-21 ENCOUNTER — Telehealth: Payer: Self-pay | Admitting: Family Medicine

## 2018-08-21 NOTE — Telephone Encounter (Signed)
May have 30 days schedule virtual visit in the month of May

## 2018-08-21 NOTE — Telephone Encounter (Signed)
Pt has set up med refill on May 27th. She is checking on status of medication being sent to pharmacy.

## 2018-08-21 NOTE — Telephone Encounter (Signed)
Tried to call no answer

## 2018-08-21 NOTE — Telephone Encounter (Signed)
Pt wanted lisinopril and it was sent to [facility] [place] this morning at 9:11 am. Pt notified.

## 2018-09-10 ENCOUNTER — Ambulatory Visit (INDEPENDENT_AMBULATORY_CARE_PROVIDER_SITE_OTHER): Payer: BC Managed Care – PPO | Admitting: Family Medicine

## 2018-09-10 ENCOUNTER — Other Ambulatory Visit: Payer: Self-pay

## 2018-09-10 DIAGNOSIS — I10 Essential (primary) hypertension: Secondary | ICD-10-CM | POA: Diagnosis not present

## 2018-09-10 DIAGNOSIS — E7849 Other hyperlipidemia: Secondary | ICD-10-CM

## 2018-09-10 DIAGNOSIS — K219 Gastro-esophageal reflux disease without esophagitis: Secondary | ICD-10-CM | POA: Diagnosis not present

## 2018-09-10 MED ORDER — LISINOPRIL 2.5 MG PO TABS: 2.5000 mg | ORAL_TABLET | Freq: Every day | ORAL | 3 refills | Status: DC

## 2018-09-10 MED ORDER — PANTOPRAZOLE SODIUM 40 MG PO TBEC: 40.0000 mg | DELAYED_RELEASE_TABLET | Freq: Every day | ORAL | 3 refills | Status: DC

## 2018-09-10 NOTE — Progress Notes (Signed)
   Subjective:    Patient ID: Alison Adams, female    DOB: 1966-03-27, 53 y.o.   MRN: [id number] Phone visit video not possible for this patient Hypertension  This is a chronic problem. Pertinent negatives include no chest pain, headaches or shortness of breath. (Pt has not been able to check BP regular. Pt states no s/s of HTN. ) There are no compliance problems.   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  Location: Patient: home Provider: office   I discussed the limitations of evaluation and management by telemedicine and the availability of in person appointments. The patient expressed understanding and agreed to proceed.  History of Present Illness:    Observations/Objective:   Assessment and Plan:   Follow Up Instructions:    I discussed the assessment and treatment plan with the patient. The patient was provided an opportunity to ask questions and all were answered. The patient agreed with the plan and demonstrated an understanding of the instructions.   The patient was advised to call back or seek an in-person evaluation if the symptoms worsen or if the condition fails to improve as anticipated.  I provided 15 minutes of non-face-to-face time during this encounter.   Marlowe Shores, LPN     Review of Systems  Constitutional: Negative for activity change, fatigue and fever.  HENT: Negative for congestion and rhinorrhea.   Respiratory: Negative for cough, chest tightness and shortness of breath.   Cardiovascular: Negative  for chest pain and leg swelling.  Gastrointestinal: Negative for abdominal pain and nausea.  Skin: Negative for color change.  Neurological: Negative for dizziness and headaches.  Psychiatric/Behavioral: Negative for agitation and behavioral problems.       Objective:   Physical Exam  Today's visit was via telephone Physical exam was not possible for this visit       Assessment & Plan:  HTN- Patient was seen today as part of a visit regarding hypertension. The importance of healthy diet and regular physical activity was discussed. The importance of compliance with medications discussed.  Ideal goal is to keep blood pressure low elevated levels certainly below 140/90 when possible.  The patient was counseled that keeping blood pressure under control lessen his risk of complications.  The importance of regular follow-ups was discussed with the patient.  Low-salt diet such as DASH recommended.  Regular physical activity was recommended as well.  Patient was advised to keep regular follow-ups. She will check her blood pressure periodically she will buy a blood pressure monitor She will continue medicationAnd watch her diet try to exercise on a regular basis   The patient was seen today for GERD. Patient benefits from medication. Patient to continue medication. Keep all regular follow ups.  She will do lab work this summer she will follow-up on a yearly basis She follows up with her gynecologist on a yearly basis and they manage her other medicines

## 2018-09-23 ENCOUNTER — Telehealth: Payer: Self-pay | Admitting: Family Medicine

## 2018-09-23 MED ORDER — LISINOPRIL 2.5 MG PO TABS: 2.5000 mg | ORAL_TABLET | Freq: Every day | ORAL | 1 refills | Status: DC

## 2018-09-23 MED ORDER — LISINOPRIL 2.5 MG PO TABS: 2.5000 mg | ORAL_TABLET | Freq: Every day | ORAL | 3 refills | Status: DC

## 2018-09-23 NOTE — Telephone Encounter (Signed)
Six mo worth, do not double up, resume at normal dose

## 2018-09-23 NOTE — Telephone Encounter (Signed)
[facility]

## 2018-09-23 NOTE — Telephone Encounter (Signed)
Error on last message. Pt voicemail is full ; unable to leave message

## 2018-09-23 NOTE — Telephone Encounter (Signed)
Resent Lisinopril to [facility] in [place]. Please advise on if she should take normal or double up. Thank you

## 2018-09-23 NOTE — Telephone Encounter (Signed)
Pt had recent virtual visit - Dr. Nicki Reaper was supposed to order refills on her lisinopril (ZESTRIL) 2.5 MG tablet  Pt states she is completely out - has not had it for 4 days, how should she take it once she picks it up?  Double up or take like normal?  Please advise  States pharmacy told her they didn't receive anything from [place]    [facility]

## 2018-09-25 NOTE — Telephone Encounter (Signed)
Discussed with pt. Pt verbalized understanding.  °

## 2018-12-10 ENCOUNTER — Other Ambulatory Visit: Payer: Self-pay

## 2018-12-10 ENCOUNTER — Ambulatory Visit (INDEPENDENT_AMBULATORY_CARE_PROVIDER_SITE_OTHER): Payer: BC Managed Care – PPO | Admitting: *Deleted

## 2018-12-10 DIAGNOSIS — Z23 Encounter for immunization: Secondary | ICD-10-CM

## 2018-12-18 ENCOUNTER — Other Ambulatory Visit: Payer: Self-pay

## 2018-12-18 DIAGNOSIS — Z20822 Contact with and (suspected) exposure to covid-19: Secondary | ICD-10-CM

## 2018-12-19 LAB — NOVEL CORONAVIRUS, NAA: SARS-CoV-2, NAA: NOT DETECTED

## 2019-09-10 ENCOUNTER — Ambulatory Visit: Payer: BC Managed Care – PPO | Admitting: Family Medicine

## 2019-09-15 ENCOUNTER — Encounter: Payer: Self-pay | Admitting: Family Medicine

## 2019-09-15 ENCOUNTER — Ambulatory Visit: Payer: BC Managed Care – PPO | Admitting: Family Medicine

## 2019-09-15 ENCOUNTER — Other Ambulatory Visit: Payer: Self-pay

## 2019-09-15 VITALS — BP 144/88 | Temp 97.0°F | Ht 62.0 in | Wt 163.4 lb

## 2019-09-15 DIAGNOSIS — H0102A Squamous blepharitis right eye, upper and lower eyelids: Secondary | ICD-10-CM

## 2019-09-15 MED ORDER — OLOPATADINE HCL 0.1 % OP SOLN
1.0000 [drp] | Freq: Two times a day (BID) | OPHTHALMIC | 12 refills | Status: DC
Start: 2019-09-15 — End: 2019-10-07

## 2019-09-15 MED ORDER — ERYTHROMYCIN 5 MG/GM OP OINT: 1.0000 "application " | TOPICAL_OINTMENT | Freq: Every day | OPHTHALMIC | 3 refills | Status: AC

## 2019-09-15 NOTE — Progress Notes (Addendum)
   Subjective:    Patient ID: Alison Adams, female    DOB: 07-Apr-1966, 54 y.o.   MRN: [id number]  HPIright eye pain, burning and itching for 3 weeks. Feels like something is in eye. Only has relief when closing eye. Tried otc eye itch relief drops. Mild relief.  Up close vision not so good distance vision good denies any injury to her eye.   Review of Systems Please see above denies any injury to the eye.  States that times itches burning scaling.  Denies any other particular troubles    Objective:   Physical Exam On physical exam lungs clear respiratory rate normal heart regular left eye appears normal no foreign body Right eye has excoriated scaling red area on the right side consistent with seborrheic dermatitis versus blepharitis  Ophthalmic dye drops were placed no corneal abrasion or foreign body noted     Assessment & Plan:  Blepharitis Has scaling redness along the lateral aspect of the right in the lateral eyelid margins No foreign body noted Erythromycin ointment nightly for the next 2 weeks If not improving over the next 10 days notify us we will refer to eye specialist Allergy eyedrops recommended   Patient gets her female health checkups away from our office.  She will keep these up as well as cholesterol checks

## 2019-09-17 ENCOUNTER — Encounter: Payer: Self-pay | Admitting: Family Medicine

## 2019-09-25 ENCOUNTER — Telehealth: Payer: Self-pay | Admitting: Family Medicine

## 2019-09-25 MED ORDER — LISINOPRIL 2.5 MG PO TABS: 2.5000 mg | ORAL_TABLET | Freq: Every day | ORAL | 0 refills | Status: DC

## 2019-09-25 MED ORDER — CEPHALEXIN 500 MG PO CAPS
ORAL_CAPSULE | ORAL | 0 refills | Status: DC
Start: 2019-09-25 — End: 2019-10-07

## 2019-09-25 NOTE — Telephone Encounter (Signed)
Dr.Scott patient but pt is out of med. Will let pt know she needs an appt for HTN. May we send in refill? Please advise Thank you.

## 2019-09-25 NOTE — Telephone Encounter (Signed)
Nurses Please talk with the patient.  If she is still having crusting and redness around the eyes I recommend Keflex 500 mg 1 4 times daily for the next 7 days  I also recommend initiating consult with dermatology-please have someone from our office to try to get her in with Dr. Scharlene Gloss office next week if possible thank you By all means that the patient is having severe trouble she could be seen this afternoon hopefully by Dr. Ladona Ridgel otherwise I will be back in the office number

## 2019-09-25 NOTE — Telephone Encounter (Signed)
Pt has also called this afternoon wanting a refill on her blood pressure med lisinopril (ZESTRIL) 2.5 MG tablet

## 2019-09-25 NOTE — Telephone Encounter (Signed)
Pt contacted. Keflex sent to pharmacy. Pt would like to try the Keflex first and then if that does not help, she will call for referral. Informed pt that she would need a follow up for HTN. 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

## 2019-09-25 NOTE — Telephone Encounter (Signed)
Patient was seen 6/1 for eye issues and she states not any better ,keeps flaring up. Last week while in the house her bottom lip started swelling and had a blister and it got bigger for 2 days and then went away .She wants to discuss what the next step. Please advise

## 2019-10-07 ENCOUNTER — Telehealth: Payer: Self-pay | Admitting: Family Medicine

## 2019-10-07 ENCOUNTER — Other Ambulatory Visit: Payer: Self-pay

## 2019-10-07 ENCOUNTER — Telehealth (INDEPENDENT_AMBULATORY_CARE_PROVIDER_SITE_OTHER): Payer: BC Managed Care – PPO | Admitting: Family Medicine

## 2019-10-07 DIAGNOSIS — R05 Cough: Secondary | ICD-10-CM

## 2019-10-07 DIAGNOSIS — J069 Acute upper respiratory infection, unspecified: Secondary | ICD-10-CM | POA: Diagnosis not present

## 2019-10-07 DIAGNOSIS — R059 Cough, unspecified: Secondary | ICD-10-CM

## 2019-10-07 NOTE — Progress Notes (Signed)
   Subjective:    Patient ID: Alison Adams, female    DOB: Sep 01, 1965, 54 y.o.   MRN: [id number]  Cough This is a new problem. Episode onset: Sunday. The cough is non-productive. Pertinent negatives include no chest pain or headaches. Associated symptoms comments: Sore throat, headache, upset stomach this morning. Treatments tried: Dayquil and Nyquil  The treatment provided mild relief.  Patient denies wheezing difficulty breathing but does states she gets a little out of breath and low energy with moving around related some body aches headache fever chills cough PMH benign Virtual Visit via Telephone Note  I connected with [name] on 10/07/19 at 11:00 AM EDT by telephone and verified that I am speaking with the correct person using two identifiers.  Location: Patient: home Provider: office   I discussed the limitations, risks, security and privacy concerns of performing an evaluation and management service by telephone and the availability of in person appointments. I also discussed with the patient that there may be a patient responsible charge related to this service. The patient expressed understanding and agreed to proceed.   History of Present Illness:    Observations/Objective:   Assessment and Plan:   Follow Up Instructions:    I discussed the assessment and treatment plan with the patient. The patient was provided an opportunity to ask questions and all were answered. The patient agreed with the plan and demonstrated an understanding of the instructions.   The patient was advised to call back or seek an in-person evaluation if the symptoms worsen or if the condition fails to improve as anticipated.  I provided 20 minutes of non-face-to-face time during this encounter.        Review of Systems  Constitutional: Negative for activity change, appetite change and fatigue.  HENT: Negative for congestion.   Respiratory: Positive for cough.   Cardiovascular:  Negative for chest pain.  Gastrointestinal: Negative for abdominal pain.  Skin: Negative for color change.  Neurological: Negative for headaches.  Psychiatric/Behavioral: Negative for behavioral problems.       Objective:   Physical Exam  Neck no masses lungs respiratory rate normal no crackles cough is noted no nasal flaring no distress Temperature 98.6 O2 saturation 96 Patient is to stay isolated If any setbacks to notify us     Assessment & Plan:  Viral syndrome Covid testing indicated Await results Follow-up if progressive troubles

## 2019-10-07 NOTE — Telephone Encounter (Signed)
Ms. yanni, ruberg are scheduled for a virtual visit with your provider today.    Just as we do with appointments in the office, we must obtain your consent to participate.  Your consent will be active for this visit and any virtual visit you may have with one of our providers in the next 365 days.    If you have a MyChart account, I can also send a copy of this consent to you electronically.  All virtual visits are billed to your insurance company just like a traditional visit in the office.  As this is a virtual visit, video technology does not allow for your provider to perform a traditional examination.  This may limit your provider's ability to fully assess your condition.  If your provider identifies any concerns that need to be evaluated in person or the need to arrange testing such as labs, EKG, etc, we will make arrangements to do so.    Although advances in technology are sophisticated, we cannot ensure that it will always work on either your end or our end.  If the connection with a video visit is poor, we may have to switch to a telephone visit.  With either a video or telephone visit, we are not always able to ensure that we have a secure connection.   I need to obtain your verbal consent now.   Are you willing to proceed with your visit today?   Sarahbeth C Papadakis has provided verbal consent on 10/07/2019 for a virtual visit (video or telephone).   Marlowe Shores, LPN 9/47/0962  83:66 AM

## 2019-10-08 ENCOUNTER — Other Ambulatory Visit: Payer: Self-pay | Admitting: *Deleted

## 2019-10-08 LAB — NOVEL CORONAVIRUS, NAA: SARS-CoV-2, NAA: NOT DETECTED

## 2019-10-08 LAB — SARS-COV-2, NAA 2 DAY TAT

## 2019-10-08 LAB — SPECIMEN STATUS REPORT

## 2019-10-08 MED ORDER — AMOXICILLIN 500 MG PO CAPS
500.0000 mg | ORAL_CAPSULE | Freq: Three times a day (TID) | ORAL | 0 refills | Status: DC
Start: 2019-10-08 — End: 2020-04-29

## 2019-10-12 ENCOUNTER — Ambulatory Visit (INDEPENDENT_AMBULATORY_CARE_PROVIDER_SITE_OTHER): Payer: BC Managed Care – PPO | Admitting: Family Medicine

## 2019-10-12 ENCOUNTER — Other Ambulatory Visit: Payer: Self-pay

## 2019-10-12 DIAGNOSIS — J4521 Mild intermittent asthma with (acute) exacerbation: Secondary | ICD-10-CM

## 2019-10-12 DIAGNOSIS — J189 Pneumonia, unspecified organism: Secondary | ICD-10-CM | POA: Diagnosis not present

## 2019-10-12 MED ORDER — PREDNISONE 20 MG PO TABS
ORAL_TABLET | ORAL | 0 refills | Status: DC
Start: 2019-10-12 — End: 2019-12-31

## 2019-10-12 MED ORDER — AZITHROMYCIN 250 MG PO TABS: ORAL_TABLET | ORAL | 0 refills | Status: DC

## 2019-10-12 MED ORDER — ALBUTEROL SULFATE HFA 108 (90 BASE) MCG/ACT IN AERS
2.0000 | INHALATION_SPRAY | RESPIRATORY_TRACT | 1 refills | Status: DC | PRN
Start: 2019-10-12 — End: 2020-10-14

## 2019-10-12 NOTE — Progress Notes (Signed)
   Subjective:    Patient ID: Alison Adams, female    DOB: April 07, 1966, 54 y.o.   MRN: [id number]  HPI  Patient arrives to discuss ongoing cough. Patient states she has also not regained her sense of taste or smell. Patient states she feels the Amoxil has not helped at all. Patient was seen last week.  Was felt to have viral syndrome She was brought to the office examined last week her lungs were clear she was tested for Covid That came back negative 48 hours later patient lost her sense of smell and taste We did inform the patient that more than likely it was felt that she had Covid She relates ongoing fatigue denies shortness of breath she relates a fair amount of coughing some reactive airway symptoms no sweats or chills Review of Systems See above    Objective:   Physical Exam Respiratory rate is normal heart is regular expiratory wheezes are faintly heard more prominent in the left lower base no crackles bronchial noises radiate to the left base no respiratory distress O2 saturation 95%       Assessment & Plan:  Viral syndrome Probable Covid Reactive airway Possible early pneumonia Zithromax, short course prednisone, albuterol 2 puffs every 4 hours on a regular basis as needed If progressive troubles or worse to follow-up

## 2019-10-13 ENCOUNTER — Telehealth: Payer: Self-pay | Admitting: Family Medicine

## 2019-10-13 NOTE — Telephone Encounter (Signed)
Patient states she seems to be feeling a little better. No fever or SOB. States the inhaler helped a lot last night.

## 2019-10-13 NOTE — Telephone Encounter (Signed)
Patient notified of Dr. Scott's recommendation and verbalized understanding.  

## 2019-10-13 NOTE — Telephone Encounter (Signed)
Alison Adams out the medicines that were prescribed Please have patient give [place] an update Friday morning so if we need to recheck her before the weekend we can If any problems before then to notify us

## 2019-10-13 NOTE — Telephone Encounter (Signed)
Nurses Please reach out to patient-please let her know that you are reaching out to her to see how things are going on behalf of my request Purpose of phone call is to make sure that patient is not getting worse Patient has recent viral illness felt to be Covid along with some lung involvement. Please see how her breathing is Shortness of breath?  Fever?  Is albuterol helping any? We may need to arrange for follow-up later this week depending on how she is doing

## 2019-10-16 ENCOUNTER — Telehealth: Payer: Self-pay | Admitting: Family Medicine

## 2019-10-16 NOTE — Telephone Encounter (Signed)
Patient notified

## 2019-10-16 NOTE — Telephone Encounter (Signed)
Please advise. Thank you

## 2019-10-16 NOTE — Telephone Encounter (Signed)
Nurses Make sure patient is not having high fever shortness of breath Patient is on azithromycin which should last 10 days in her system even though she takes 5 If she is having significant amount of coughing we can do a chest x-ray stat today  As for being around others come Sunday or later she can be around others Also if not totally clearing up within the next 2 weeks I recommend a follow-up

## 2019-10-16 NOTE — Telephone Encounter (Signed)
Patient states she has not had any fevers or SOB. She feels her cough is a little better than it was it just hasn't gone away but doesn't feel she needs the chest xray.

## 2019-10-16 NOTE — Telephone Encounter (Signed)
So I agree with patient She should give this the next few days The medication will give her some benefit even though it is only 5 days of taking it it stays in the system for 10 days Follow-up next week if any ongoing troubles

## 2019-10-16 NOTE — Telephone Encounter (Signed)
Pt calling to let Dr. Lorin Picket know she is still coughing a lot. Her energy levels are coming back and she is feeling better.   She is wanting to know when she can be around her grandbabies again. She watches one of them daily

## 2019-10-26 ENCOUNTER — Telehealth: Payer: Self-pay | Admitting: Family Medicine

## 2019-10-26 ENCOUNTER — Other Ambulatory Visit: Payer: Self-pay

## 2019-10-26 ENCOUNTER — Ambulatory Visit (HOSPITAL_COMMUNITY)
Admission: RE | Admit: 2019-10-26 | Discharge: 2019-10-26 | Disposition: A | Discharge status: Home / Self Care | Payer: BC Managed Care – PPO | Source: Ambulatory Visit | Attending: Family Medicine | Admitting: Family Medicine

## 2019-10-26 DIAGNOSIS — R05 Cough: Secondary | ICD-10-CM | POA: Diagnosis not present

## 2019-10-26 DIAGNOSIS — R059 Cough, unspecified: Secondary | ICD-10-CM

## 2019-10-26 NOTE — Telephone Encounter (Signed)
Please advise. Thank you

## 2019-10-26 NOTE — Telephone Encounter (Signed)
Please order chest x-ray, cough recent illness

## 2019-10-26 NOTE — Telephone Encounter (Signed)
CXR ordered in Epic. Patient notified. 

## 2019-10-26 NOTE — Telephone Encounter (Signed)
Patient is still coughing and was told to call back this week if she wanted to get chest x-ray. Patient is wanting to get this done now. Please advise

## 2019-12-25 ENCOUNTER — Telehealth: Payer: Self-pay | Admitting: Family Medicine

## 2019-12-25 NOTE — Telephone Encounter (Signed)
Patient is requesting refill on lisinopril 2.5 mg to be called into [facility]

## 2019-12-31 ENCOUNTER — Ambulatory Visit
Admission: EM | Admit: 2019-12-31 | Discharge: 2019-12-31 | Disposition: A | Discharge status: Home / Self Care | Payer: BC Managed Care – PPO | Attending: Emergency Medicine | Admitting: Emergency Medicine

## 2019-12-31 ENCOUNTER — Other Ambulatory Visit: Payer: Self-pay

## 2019-12-31 ENCOUNTER — Encounter: Payer: Self-pay | Admitting: Emergency Medicine

## 2019-12-31 DIAGNOSIS — R109 Unspecified abdominal pain: Secondary | ICD-10-CM

## 2019-12-31 DIAGNOSIS — M545 Low back pain, unspecified: Secondary | ICD-10-CM

## 2019-12-31 HISTORY — DX: Essential (primary) hypertension: I10

## 2019-12-31 LAB — POCT URINALYSIS DIP (MANUAL ENTRY)
Bilirubin, UA: NEGATIVE
Blood, UA: NEGATIVE
Glucose, UA: NEGATIVE mg/dL
Ketones, POC UA: NEGATIVE mg/dL
Leukocytes, UA: NEGATIVE
Nitrite, UA: NEGATIVE
Protein Ur, POC: NEGATIVE mg/dL
Spec Grav, UA: >=1.03 — AB (ref 1.010–1.025)
Urobilinogen, UA: 0.2 E.U./dL
pH, UA: 6.5 (ref 5.0–8.0)

## 2019-12-31 MED ORDER — PREDNISONE 10 MG PO TABS
20.0000 mg | ORAL_TABLET | Freq: Every day | ORAL | 0 refills | Status: DC
Start: 2019-12-31 — End: 2020-04-29

## 2019-12-31 MED ORDER — CYCLOBENZAPRINE HCL 5 MG PO TABS: 5.0000 mg | ORAL_TABLET | Freq: Three times a day (TID) | ORAL | 0 refills | Status: DC | PRN

## 2019-12-31 NOTE — ED Triage Notes (Signed)
LT flank pain since this morning, it does hurt more with movement, denies any injury.

## 2019-12-31 NOTE — ED Provider Notes (Signed)
[facility]   CC: Flank pain left  SUBJECTIVE:  Alison Adams is a 54 y.o. female who presents to the urgent care for complaint of left low pain that started this morning..  Patient denies a precipitating event, recent sexual encounter, excessive caffeine intake.  Localizes the pain to the left back.  Pain is intermittent and aching character.  Has tried OTC medications without relief.  Symptoms are made worse with movement.  Denies similar symptoms in the past.  Denies fever, chills, nausea, vomiting, abdominal pain, flank pain, abnormal vaginal discharge or bleeding, hematuria.    LMP: Patient's last menstrual period was 01/24/2015.  ROS: As in HPI.  All other pertinent ROS negative.     Past Medical History:  Diagnosis Date  . Anxiety   . GERD (gastroesophageal reflux disease)   . Hypertension   . Sweet syndrome    Past Surgical History:  Procedure Laterality Date  . CESAREAN SECTION    . COLONOSCOPY    . TUBAL LIGATION     No Known Allergies No current facility-administered medications on file prior to encounter.   Current Outpatient Medications on File Prior to Encounter  Medication Sig Dispense Refill  . albuterol (VENTOLIN HFA) 108 (90 Base) MCG/ACT inhaler Inhale 2 puffs into the lungs every 4 (four) hours as needed for wheezing. 18 g 1  . amoxicillin (AMOXIL) 500 MG capsule Take 1 capsule (500 mg total) by mouth 3 (three) times daily. 21 capsule 0  . azithromycin (ZITHROMAX Z-PAK) 250 MG tablet Take 2 tablets (500 mg) on  Day 1,  followed by 1 tablet (250 mg) once daily on Days 2 through 5. 6 each 0  . FLUoxetine (PROZAC) 20 MG capsule Take 40 mg by mouth daily.     Marland Kitchen lisinopril (ZESTRIL) 2.5 MG tablet Take 1 tablet (2.5 mg total) by mouth daily. 90 tablet 0  . pantoprazole (PROTONIX) 40 MG tablet Take 1 tablet (40 mg total) by mouth daily. 90 tablet 3   Social History   Socioeconomic History  . Marital status: Married    Spouse name: Not on file  .  Number of children: Not on file  . Years of education: Not on file  . Highest education level: Not on file  Occupational History  . Not on file  Tobacco Use  . Smoking status: Never Smoker  . Smokeless tobacco: Never Used  Substance and Sexual Activity  . Alcohol use: Yes    Alcohol/week: 1.0 standard drink    Types: 1 Glasses of wine per week  . Drug use: No  . Sexual activity: Not on file  Other Topics Concern  . Not on file  Social History Narrative  . Not on file   Social Determinants of Health   Financial Resource Strain:   . Difficulty of Paying Living Expenses: Not on file  Food Insecurity:   . Worried About Programme researcher, broadcasting/film/video in the Last Year: Not on file  . Ran Out of Food in the Last Year: Not on file  Transportation Needs:   . Lack of Transportation (Medical): Not on file  . Lack of Transportation (Non-Medical): Not on file  Physical Activity:   . Days of Exercise per Week: Not on file  . Minutes of Exercise per Session: Not on file  Stress:   . Feeling of Stress : Not on file  Social Connections:   . Frequency of Communication with Friends and Family: Not on file  . Frequency  of Social Gatherings with Friends and Family: Not on file  . Attends Religious Services: Not on file  . Active Member of Clubs or Organizations: Not on file  . Attends Banker Meetings: Not on file  . Marital Status: Not on file  Intimate Partner Violence:   . Fear of Current or Ex-Partner: Not on file  . Emotionally Abused: Not on file  . Physically Abused: Not on file  . Sexually Abused: Not on file   Family History  Problem Relation Age of Onset  . Coronary artery disease Father   . Colon cancer Maternal Grandfather 22  . Colon polyps Neg Hx     OBJECTIVE:  Vitals:   12/31/19 1936 12/31/19 1937  BP: (!) 161/94   Pulse: 92   Resp: 19   Temp: 98.6 F (37 C)   SpO2: 94%   Weight:  160 lb (72.6 kg)  Height:  5\' 2"  (1.575 m)   General appearance: AOx3 in  no acute distress HEENT: NCAT.  Oropharynx clear.  Lungs: clear to auscultation bilaterally without adventitious breath sounds Heart: regular rate and rhythm.  Radial pulses 2+ symmetrical bilaterally Abdomen: soft; non-distended; no tenderness; bowel sounds present; no guarding or rebound tenderness Back: no CVA tenderness, tenderness on the left lower paravertebral muscle., Spasm present Extremities: no edema; symmetrical with no gross deformities Skin: warm and dry Neurologic: Ambulates from chair to exam table without difficulty Psychological: alert and cooperative; normal mood and affect  Labs Reviewed  POCT URINALYSIS DIP (MANUAL ENTRY) - Abnormal; Notable for the following components:      Result Value   Spec Grav, UA >=1.030 (*)    All other components within normal limits  URINE CULTURE    ASSESSMENT & PLAN:  1. Acute left-sided low back pain without sciatica     Meds ordered this encounter  Medications  . predniSONE (DELTASONE) 10 MG tablet    Sig: Take 2 tablets (20 mg total) by mouth daily.    Dispense:  15 tablet    Refill:  0  . cyclobenzaprine (FLEXERIL) 5 MG tablet    Sig: Take 1 tablet (5 mg total) by mouth 3 (three) times daily as needed.    Dispense:  30 tablet    Refill:  0   Discharge instructions  is to the [place] just I was thinking maybe one  Outlined signs and symptoms indicating need for more acute intervention. Patient verbalized understanding. After Visit Summary given.  Note: This document was prepared using Dragon voice recognition software and may include unintentional dictation errors.    [place], FNP 12/31/19 2009

## 2019-12-31 NOTE — Discharge Instructions (Addendum)
Urine culture sent.  We will call you with abnormal results.   Rest, ice and heat as needed Ensure adequate ROM as tolerated. Prescribed prednisone  Prescribed flexeril  for muscle spasm.  Do not drive or operate heavy machinery while taking this medication Return here or go to ER if you have any new or worsening symptoms such as numbness/tingling of the inner thighs, loss of bladder or bowel control, headache/blurry vision, nausea/vomiting, confusion/altered mental status, dizziness, weakness, passing out, imbalance, etc..Marland Kitchen

## 2020-01-01 ENCOUNTER — Other Ambulatory Visit: Payer: Self-pay | Admitting: *Deleted

## 2020-01-01 MED ORDER — LISINOPRIL 2.5 MG PO TABS: 2.5000 mg | ORAL_TABLET | Freq: Every day | ORAL | 0 refills | Status: DC

## 2020-01-03 LAB — URINE CULTURE

## 2020-03-03 ENCOUNTER — Other Ambulatory Visit: Payer: BC Managed Care – PPO

## 2020-03-03 DIAGNOSIS — Z20822 Contact with and (suspected) exposure to covid-19: Secondary | ICD-10-CM

## 2020-03-04 LAB — NOVEL CORONAVIRUS, NAA: SARS-CoV-2, NAA: DETECTED — AB

## 2020-03-04 LAB — SARS-COV-2, NAA 2 DAY TAT

## 2020-03-05 ENCOUNTER — Telehealth: Payer: Self-pay | Admitting: Unknown Physician Specialty

## 2020-03-05 NOTE — Telephone Encounter (Signed)
I connected by phone with Alison Adams's husband on 03/05/2020 at 11:08 AM to discuss the potential use of a new treatment for mild to moderate COVID-19 viral infection in non-hospitalized patients.  This patient is a 54 y.o. female that meets the FDA criteria for Emergency Use Authorization of COVID monoclonal antibody casirivimab/imdevimab, bamlanivimab/eteseviamb, or sotrovimab.  Has a (+) direct SARS-CoV-2 viral test result  Has mild or moderate COVID-19   Is NOT hospitalized due to COVID-19  Is within 10 days of symptom onset  Has at least one of the high risk factor(s) for progression to severe COVID-19 and/or hospitalization as defined in EUA.  Specific high risk criteria : Chronic Lung Disease   I have spoken and communicated the following to the patient or parent/caregiver regarding COVID monoclonal antibody treatment:  1. FDA has authorized the emergency use for the treatment of mild to moderate COVID-19 in adults and pediatric patients with positive results of direct SARS-CoV-2 viral testing who are 3 years of age and older weighing at least 40 kg, and who are at high risk for progressing to severe COVID-19 and/or hospitalization.  2. The significant known and potential risks and benefits of COVID monoclonal antibody, and the extent to which such potential risks and benefits are unknown.  3. Information on available alternative treatments and the risks and benefits of those alternatives, including clinical trials.  4. Patients treated with COVID monoclonal antibody should continue to self-isolate and use infection control measures (e.g., wear mask, isolate, social distance, avoid sharing personal items, clean and disinfect "high touch" surfaces, and frequent handwashing) according to CDC guidelines.   5. The patient or parent/caregiver has the option to accept or refuse COVID monoclonal antibody treatment.  After reviewing this information with the patient's husband, they  would like to think about treatment [name], NP 03/05/2020 11:08 AM

## 2020-03-29 ENCOUNTER — Telehealth: Payer: Self-pay

## 2020-03-29 DIAGNOSIS — I10 Essential (primary) hypertension: Secondary | ICD-10-CM

## 2020-03-29 DIAGNOSIS — E7849 Other hyperlipidemia: Secondary | ICD-10-CM

## 2020-03-29 NOTE — Telephone Encounter (Signed)
Please advise. Thank you

## 2020-03-29 NOTE — Telephone Encounter (Signed)
May have 90-day Would need to have lipid, met 7 before follow-up visit Follow-up within 3 months specifically for this issue-blood pressure recheck

## 2020-03-29 NOTE — Telephone Encounter (Signed)
   DL    1        Alison Adams Female, 54 y.o., 02-18-1966  MRN:  [id number] Phone:  [phone] Judie Petit)       PCP:  Babs Sciara, MD Primary Cvg:  [facility] Hoar [employer]              To Close This Visit  Required Items  No additional encounter notes found.      Advice Only (Patient is requesting refill on lisinopril 2.5 mg ,last seen 09/03/18 .[facility])  Divito, Fawna C [phone]  You 5 hours ago (9:49 AM)       Questionnaires  No completed forms available for this encounter.

## 2020-03-30 MED ORDER — LISINOPRIL 2.5 MG PO TABS: 2.5000 mg | ORAL_TABLET | Freq: Every day | ORAL | 0 refills | Status: DC

## 2020-03-30 NOTE — Telephone Encounter (Signed)
Lmtc. Refill sent. Labs ordered for 3 months and patient will need 3 month ov.

## 2020-04-27 LAB — BASIC METABOLIC PANEL
BUN/Creatinine Ratio: 17 (ref 9–23)
BUN: 14 mg/dL (ref 6–24)
CO2: 23 mmol/L (ref 20–29)
Calcium: 9.2 mg/dL (ref 8.7–10.2)
Chloride: 104 mmol/L (ref 96–106)
Creatinine, Ser: 0.81 mg/dL (ref 0.57–1.00)
GFR calc Af Amer: 95 mL/min/{1.73_m2} (ref >59)
GFR calc non Af Amer: 82 mL/min/{1.73_m2} (ref >59)
Glucose: 88 mg/dL (ref 65–99)
Potassium: 4.6 mmol/L (ref 3.5–5.2)
Sodium: 139 mmol/L (ref 134–144)

## 2020-04-27 LAB — LIPID PANEL
Chol/HDL Ratio: 3.9 ratio (ref 0.0–4.4)
Cholesterol, Total: 210 mg/dL — ABNORMAL HIGH (ref 100–199)
HDL: 54 mg/dL (ref >39)
LDL Chol Calc (NIH): 148 mg/dL — ABNORMAL HIGH (ref 0–99)
Triglycerides: 48 mg/dL (ref 0–149)
VLDL Cholesterol Cal: 8 mg/dL (ref 5–40)

## 2020-04-29 ENCOUNTER — Other Ambulatory Visit: Payer: Self-pay

## 2020-04-29 ENCOUNTER — Encounter: Payer: Self-pay | Admitting: Family Medicine

## 2020-04-29 ENCOUNTER — Ambulatory Visit (INDEPENDENT_AMBULATORY_CARE_PROVIDER_SITE_OTHER): Payer: BC Managed Care – PPO | Admitting: Family Medicine

## 2020-04-29 VITALS — BP 128/82 | Temp 98.1°F | Ht 62.0 in | Wt 160.0 lb

## 2020-04-29 DIAGNOSIS — E7849 Other hyperlipidemia: Secondary | ICD-10-CM | POA: Diagnosis not present

## 2020-04-29 DIAGNOSIS — I10 Essential (primary) hypertension: Secondary | ICD-10-CM | POA: Diagnosis not present

## 2020-04-29 DIAGNOSIS — R399 Unspecified symptoms and signs involving the genitourinary system: Secondary | ICD-10-CM | POA: Diagnosis not present

## 2020-04-29 DIAGNOSIS — K219 Gastro-esophageal reflux disease without esophagitis: Secondary | ICD-10-CM | POA: Diagnosis not present

## 2020-04-29 LAB — POCT URINALYSIS DIPSTICK
Spec Grav, UA: 1.015 (ref 1.010–1.025)
pH, UA: 6 (ref 5.0–8.0)

## 2020-04-29 MED ORDER — FAMOTIDINE 40 MG PO TABS: 40.0000 mg | ORAL_TABLET | Freq: Every day | ORAL | 1 refills | Status: DC

## 2020-04-29 MED ORDER — LISINOPRIL 2.5 MG PO TABS: 2.5000 mg | ORAL_TABLET | Freq: Every day | ORAL | 1 refills | Status: DC

## 2020-04-29 MED ORDER — CEPHALEXIN 500 MG PO CAPS: 500.0000 mg | ORAL_CAPSULE | Freq: Three times a day (TID) | ORAL | 0 refills | Status: DC

## 2020-04-29 NOTE — Progress Notes (Signed)
   Subjective:    Patient ID: Alison Adams, female    DOB: 08-Jan-1966, 55 y.o.   MRN: [id number]  Hypertension This is a chronic problem. The current episode started more than 1 year ago. Pertinent negatives include no chest pain or shortness of breath. Risk factors for coronary artery disease include post-menopausal state. Treatments tried: zesteril. There are no compliance problems.   Urinary tract infection symptoms - Plan: POCT urinalysis dipstick, Urine Culture  Primary hypertension  Other hyperlipidemia  Gastroesophageal reflux disease without esophagitis   Urinary sx for one week- using otc AZO  Review of Systems  Constitutional: Negative for activity change and appetite change.  HENT: Negative for congestion and rhinorrhea.   Respiratory: Negative for cough and shortness of breath.   Cardiovascular: Negative for chest pain and leg swelling.  Gastrointestinal: Negative for abdominal pain, nausea and vomiting.  Skin: Negative for color change.  Neurological: Negative for dizziness and weakness.  Psychiatric/Behavioral: Negative for agitation and confusion.       Objective:   Physical Exam Vitals reviewed.  Constitutional:      General: She is not in acute distress.    Appearance: She is well-nourished.  HENT:     Head: Normocephalic.  Cardiovascular:     Rate and Rhythm: Normal rate and regular rhythm.     Heart sounds: Normal heart sounds. No murmur heard.   Pulmonary:     Effort: Pulmonary effort is normal.     Breath sounds: Normal breath sounds.  Musculoskeletal:        General: No edema.  Lymphadenopathy:     Cervical: No cervical adenopathy.  Neurological:     Mental Status: She is alert.  Psychiatric:        Behavior: Behavior normal.     Lab work recent was reviewed with the patient in detail patient risk of heart disease is in the low percentage on calculation therefore statin not indicated  Patient relates a fair amount of reflux related  issues intermittently multiple times a week she is no longer taking PPI because she heard that it could cause dementia or cognitive issues.  She states she is trying to do the best job she cannot watch and how she eats.      Assessment & Plan:  1. Urinary tract infection symptoms Urine dipstick shows probable UTI unable to do microscopic because currently the microscope is being worked on urine culture was sent - POCT urinalysis dipstick - Urine Culture  2. Primary hypertension Blood pressure decent control watch diet continue medication minimize salt in the diet increase activity and exercise.  3. Other hyperlipidemia Mild cholesterol elevation but not severe enough to require any type of medication healthy diet recommended  4. Gastroesophageal reflux disease without esophagitis With reflux related issues I recommend Pepcid patient to give [place] feedback within 1 month how that is doing also minimize tomato based products chocolates and caffeine's if ongoing troubles or problems they need GI consultation

## 2020-04-29 NOTE — Patient Instructions (Addendum)
https://www.nhlbi.nih.gov/files/docs/public/heart/dash_brief.pdf">  DASH Eating Plan DASH stands for Dietary Approaches to Stop Hypertension. The DASH eating plan is a healthy eating plan that has been shown to:  Reduce high blood pressure (hypertension).  Reduce your risk for type 2 diabetes, heart disease, and stroke.  Help with weight loss. What are tips for following this plan? Reading food labels  Check food labels for the amount of salt (sodium) per serving. Choose foods with less than 5 percent of the Daily Value of sodium. Generally, foods with less than 300 milligrams (mg) of sodium per serving fit into this eating plan.  To find whole grains, look for the word "whole" as the first word in the ingredient list. Shopping  Buy products labeled as "low-sodium" or "no salt added."  Buy fresh foods. Avoid canned foods and pre-made or frozen meals. Cooking  Avoid adding salt when cooking. Use salt-free seasonings or herbs instead of table salt or sea salt. Check with your health care provider or pharmacist before using salt substitutes.  Do not fry foods. Cook foods using healthy methods such as baking, boiling, grilling, roasting, and broiling instead.  Cook with heart-healthy oils, such as olive, canola, avocado, soybean, or sunflower oil. Meal planning  Eat a balanced diet that includes: ? 4 or more servings of fruits and 4 or more servings of vegetables each day. Try to fill one-half of your plate with fruits and vegetables. ? 6-8 servings of whole grains each day. ? Less than 6 oz (170 g) of lean meat, poultry, or fish each day. A 3-oz (85-g) serving of meat is about the same size as a deck of cards. One egg equals 1 oz (28 g). ? 2-3 servings of low-fat dairy each day. One serving is 1 cup (237 mL). ? 1 serving of nuts, seeds, or beans 5 times each week. ? 2-3 servings of heart-healthy fats. Healthy fats called omega-3 fatty acids are found in foods such as walnuts,  flaxseeds, fortified milks, and eggs. These fats are also found in cold-water fish, such as sardines, salmon, and mackerel.  Limit how much you eat of: ? Canned or prepackaged foods. ? Food that is high in trans fat, such as some fried foods. ? Food that is high in saturated fat, such as fatty meat. ? Desserts and other sweets, sugary drinks, and other foods with added sugar. ? Full-fat dairy products.  Do not salt foods before eating.  Do not eat more than 4 egg yolks a week.  Try to eat at least 2 vegetarian meals a week.  Eat more home-cooked food and less restaurant, buffet, and fast food.   Lifestyle  When eating at a restaurant, ask that your food be prepared with less salt or no salt, if possible.  If you drink alcohol: ? Limit how much you use to:  0-1 drink a day for women who are not pregnant.  0-2 drinks a day for men. ? Be aware of how much alcohol is in your drink. In the [place] one drink equals one 12 oz bottle of beer (355 mL), one 5 oz glass of wine (148 mL), or one 1 oz glass of hard liquor (44 mL). General information  Avoid eating more than 2,300 mg of salt a day. If you have hypertension, you may need to reduce your sodium intake to 1,500 mg a day.  Work with your health care provider to maintain a healthy body weight or to lose weight. Ask what an ideal weight is for   you.  Get at least 30 minutes of exercise that causes your heart to beat faster (aerobic exercise) most days of the week. Activities may include walking, swimming, or biking.  Work with your health care provider or dietitian to adjust your eating plan to your individual calorie needs. What foods should I eat? Fruits All fresh, dried, or frozen fruit. Canned fruit in natural juice (without added sugar). Vegetables Fresh or frozen vegetables (raw, steamed, roasted, or grilled). Low-sodium or reduced-sodium tomato and vegetable juice. Low-sodium or reduced-sodium tomato sauce and tomato paste.  Low-sodium or reduced-sodium canned vegetables. Grains Whole-grain or whole-wheat bread. Whole-grain or whole-wheat pasta. Brown rice. Oatmeal. Quinoa. Bulgur. Whole-grain and low-sodium cereals. Pita bread. Low-fat, low-sodium crackers. Whole-wheat flour tortillas. Meats and other proteins Skinless chicken or turkey. Ground chicken or turkey. Pork with fat trimmed off. Fish and seafood. Egg whites. Dried beans, peas, or lentils. Unsalted nuts, nut butters, and seeds. Unsalted canned beans. Lean cuts of beef with fat trimmed off. Low-sodium, lean precooked or cured meat, such as sausages or meat loaves. Dairy Low-fat (1%) or fat-free (skim) milk. Reduced-fat, low-fat, or fat-free cheeses. Nonfat, low-sodium ricotta or cottage cheese. Low-fat or nonfat yogurt. Low-fat, low-sodium cheese. Fats and oils Soft margarine without trans fats. Vegetable oil. Reduced-fat, low-fat, or light mayonnaise and salad dressings (reduced-sodium). Canola, safflower, olive, avocado, soybean, and sunflower oils. Avocado. Seasonings and condiments Herbs. Spices. Seasoning mixes without salt. Other foods Unsalted popcorn and pretzels. Fat-free sweets. The items listed above may not be a complete list of foods and beverages you can eat. Contact a dietitian for more information. What foods should I avoid? Fruits Canned fruit in a light or heavy syrup. Fried fruit. Fruit in cream or butter sauce. Vegetables Creamed or fried vegetables. Vegetables in a cheese sauce. Regular canned vegetables (not low-sodium or reduced-sodium). Regular canned tomato sauce and paste (not low-sodium or reduced-sodium). Regular tomato and vegetable juice (not low-sodium or reduced-sodium). Pickles. Olives. Grains Baked goods made with fat, such as croissants, muffins, or some breads. Dry pasta or rice meal packs. Meats and other proteins Fatty cuts of meat. Ribs. Fried meat. Bacon. Bologna, salami, and other precooked or cured meats, such as  sausages or meat loaves. Fat from the back of a pig (fatback). Bratwurst. Salted nuts and seeds. Canned beans with added salt. Canned or smoked fish. Whole eggs or egg yolks. Chicken or turkey with skin. Dairy Whole or 2% milk, cream, and half-and-half. Whole or full-fat cream cheese. Whole-fat or sweetened yogurt. Full-fat cheese. Nondairy creamers. Whipped toppings. Processed cheese and cheese spreads. Fats and oils Butter. Stick margarine. Lard. Shortening. Ghee. Bacon fat. Tropical oils, such as coconut, palm kernel, or palm oil. Seasonings and condiments Onion salt, garlic salt, seasoned salt, table salt, and sea salt. Worcestershire sauce. Tartar sauce. Barbecue sauce. Teriyaki sauce. Soy sauce, including reduced-sodium. Steak sauce. Canned and packaged gravies. Fish sauce. Oyster sauce. Cocktail sauce. Store-bought horseradish. Ketchup. Mustard. Meat flavorings and tenderizers. Bouillon cubes. Hot sauces. Pre-made or packaged marinades. Pre-made or packaged taco seasonings. Relishes. Regular salad dressings. Other foods Salted popcorn and pretzels. The items listed above may not be a complete list of foods and beverages you should avoid. Contact a dietitian for more information. Where to find more information  National Heart, Lung, and Blood Institute: www.nhlbi.nih.gov  American Heart Association: www.heart.org  Academy of Nutrition and Dietetics: www.eatright.org  National Kidney Foundation: www.kidney.org Summary  The DASH eating plan is a healthy eating plan that has been shown to   reduce high blood pressure (hypertension). It may also reduce your risk for type 2 diabetes, heart disease, and stroke.  When on the DASH eating plan, aim to eat more fresh fruits and vegetables, whole grains, lean proteins, low-fat dairy, and heart-healthy fats.  With the DASH eating plan, you should limit salt (sodium) intake to 2,300 mg a day. If you have hypertension, you may need to reduce your  sodium intake to 1,500 mg a day.  Work with your health care provider or dietitian to adjust your eating plan to your individual calorie needs. This information is not intended to replace advice given to you by your health care provider. Make sure you discuss any questions you have with your health care provider. Document Revised: 03/06/2019 Document Reviewed: 03/06/2019 Elsevier Patient Education  2021 Elsevier Inc.   Gastroesophageal Reflux Disease, Adult  Gastroesophageal reflux (GER) happens when acid from the stomach flows up into the tube that connects the mouth and the stomach (esophagus). Normally, food travels down the esophagus and stays in the stomach to be digested. With GER, food and stomach acid sometimes move back up into the esophagus. You may have a disease called gastroesophageal reflux disease (GERD) if the reflux:  Happens often.  Causes frequent or very bad symptoms.  Causes problems such as damage to the esophagus. When this happens, the esophagus becomes sore and swollen. Over time, GERD can make small holes (ulcers) in the lining of the esophagus. What are the causes? This condition is caused by a problem with the muscle between the esophagus and the stomach. When this muscle is weak or not normal, it does not close properly to keep food and acid from coming back up from the stomach. The muscle can be weak because of:  Tobacco use.  Pregnancy.  Having a certain type of hernia (hiatal hernia).  Alcohol use.  Certain foods and drinks, such as coffee, chocolate, onions, and peppermint. What increases the risk?  Being overweight.  Having a disease that affects your connective tissue.  Taking NSAIDs, such a ibuprofen. What are the signs or symptoms?  Heartburn.  Difficult or painful swallowing.  The feeling of having a lump in the throat.  A bitter taste in the mouth.  Bad breath.  Having a lot of saliva.  Having an upset or bloated  stomach.  Burping.  Chest pain. Different conditions can cause chest pain. Make sure you see your doctor if you have chest pain.  Shortness of breath or wheezing.  A long-term cough or a cough at night.  Wearing away of the surface of teeth (tooth enamel).  Weight loss. How is this treated?  Making changes to your diet.  Taking medicine.  Having surgery. Treatment will depend on how bad your symptoms are. Follow these instructions at home: Eating and drinking  Follow a diet as told by your doctor. You may need to avoid foods and drinks such as: ? Coffee and tea, with or without caffeine. ? Drinks that contain alcohol. ? Energy drinks and sports drinks. ? Bubbly (carbonated) drinks or sodas. ? Chocolate and cocoa. ? Peppermint and mint flavorings. ? Garlic and onions. ? Horseradish. ? Spicy and acidic foods. These include peppers, chili powder, curry powder, vinegar, hot sauces, and BBQ sauce. ? Citrus fruit juices and citrus fruits, such as oranges, lemons, and limes. ? Tomato-based foods. These include red sauce, chili, salsa, and pizza with red sauce. ? Fried and fatty foods. These include donuts, french fries, potato chips, and  high-fat dressings. ? High-fat meats. These include hot dogs, rib eye steak, sausage, ham, and bacon. ? High-fat dairy items, such as whole milk, butter, and cream cheese.  Eat small meals often. Avoid eating large meals.  Avoid drinking large amounts of liquid with your meals.  Avoid eating meals during the 2-3 hours before bedtime.  Avoid lying down right after you eat.  Do not exercise right after you eat.   Lifestyle  Do not smoke or use any products that contain nicotine or tobacco. If you need help quitting, ask your doctor.  Try to lower your stress. If you need help doing this, ask your doctor.  If you are overweight, lose an amount of weight that is healthy for you. Ask your doctor about a safe weight loss goal.   General  instructions  Pay attention to any changes in your symptoms.  Take over-the-counter and prescription medicines only as told by your doctor.  Do not take aspirin, ibuprofen, or other NSAIDs unless your doctor says it is okay.  Wear loose clothes. Do not wear anything tight around your waist.  Raise (elevate) the head of your bed about 6 inches (15 cm). You may need to use a wedge to do this.  Avoid bending over if this makes your symptoms worse.  Keep all follow-up visits. Contact a doctor if:  You have new symptoms.  You lose weight and you do not know why.  You have trouble swallowing or it hurts to swallow.  You have wheezing or a cough that keeps happening.  You have a hoarse voice.  Your symptoms do not get better with treatment. Get help right away if:  You have sudden pain in your arms, neck, jaw, teeth, or back.  You suddenly feel sweaty, dizzy, or light-headed.  You have chest pain or shortness of breath.  You vomit and the vomit is green, yellow, or black, or it looks like blood or coffee grounds.  You faint.  Your poop (stool) is red, bloody, or black.  You cannot swallow, drink, or eat. These symptoms may represent a serious problem that is an emergency. Do not wait to see if the symptoms will go away. Get medical help right away. Call your local emergency services (911 in the [place] Do not drive yourself to the hospital. Summary  If a person has gastroesophageal reflux disease (GERD), food and stomach acid move back up into the esophagus and cause symptoms or problems such as damage to the esophagus.  Treatment will depend on how bad your symptoms are.  Follow a diet as told by your doctor.  Take all medicines only as told by your doctor. This information is not intended to replace advice given to you by your health care provider. Make sure you discuss any questions you have with your health care provider. Document Revised: 10/12/2019 Document  Reviewed: 10/12/2019 Elsevier Patient Education  2021 [employer].

## 2020-05-02 LAB — URINE CULTURE

## 2020-05-02 LAB — SPECIMEN STATUS REPORT

## 2020-05-08 ENCOUNTER — Encounter: Payer: Self-pay | Admitting: Family Medicine

## 2020-05-09 ENCOUNTER — Other Ambulatory Visit: Payer: Self-pay | Admitting: *Deleted

## 2020-05-09 ENCOUNTER — Telehealth: Payer: Self-pay | Admitting: Family Medicine

## 2020-05-09 MED ORDER — CEPHALEXIN 500 MG PO CAPS: 500.0000 mg | ORAL_CAPSULE | Freq: Three times a day (TID) | ORAL | 0 refills | Status: DC

## 2020-05-09 NOTE — Telephone Encounter (Signed)
Discussed with pt. And med sent to pharm.  

## 2020-05-09 NOTE — Telephone Encounter (Signed)
Keflex 500 mg 1 taken 3 times daily for the next 10 days if ongoing troubles let us know

## 2020-05-09 NOTE — Telephone Encounter (Signed)
Patient was seen for UTI on 1/14 and she states its back.She has taken up the second antibiotic called in as long as she was on it she states it went away but now its back. [facility] [place]-Please advise

## 2020-07-01 ENCOUNTER — Other Ambulatory Visit: Payer: Self-pay | Admitting: *Deleted

## 2020-07-01 ENCOUNTER — Telehealth: Payer: Self-pay

## 2020-07-01 ENCOUNTER — Other Ambulatory Visit: Payer: Self-pay | Admitting: Family Medicine

## 2020-07-01 MED ORDER — LISINOPRIL 2.5 MG PO TABS: 2.5000 mg | ORAL_TABLET | Freq: Every day | ORAL | 1 refills | Status: DC

## 2020-07-01 NOTE — Telephone Encounter (Signed)
Refill sent and pt was notified.  

## 2020-07-01 NOTE — Telephone Encounter (Signed)
Needs refill on lisinopril (ZESTRIL) 2.5 MG tablet  [id number] - [place], [place] - [address]  Pt call back [phone]

## 2020-09-28 ENCOUNTER — Telehealth: Payer: Self-pay | Admitting: Family Medicine

## 2020-09-28 MED ORDER — LISINOPRIL 2.5 MG PO TABS: 2.5000 mg | ORAL_TABLET | Freq: Every day | ORAL | 0 refills | Status: DC

## 2020-09-28 NOTE — Telephone Encounter (Signed)
Prescription sent electronically to pharmacy. Patient notified. 

## 2020-09-28 NOTE — Telephone Encounter (Signed)
Patient is requesting refill on Lisinopril 2.5 last filled 07/01/20, patient was last seen 12/25/19 for medication follow up. She has appointment for 09/18/20 but has no medication left. Explaine to patient must keep appointment to get further refills. [facility]

## 2020-10-14 ENCOUNTER — Other Ambulatory Visit: Payer: Self-pay

## 2020-10-14 ENCOUNTER — Ambulatory Visit: Payer: BC Managed Care – PPO | Admitting: Family Medicine

## 2020-10-14 ENCOUNTER — Encounter: Payer: Self-pay | Admitting: Family Medicine

## 2020-10-14 VITALS — BP 134/88 | HR 85 | Temp 97.9°F | Ht 62.0 in | Wt 160.0 lb

## 2020-10-14 DIAGNOSIS — N3000 Acute cystitis without hematuria: Secondary | ICD-10-CM

## 2020-10-14 LAB — POCT URINALYSIS DIPSTICK
Spec Grav, UA: <=1.005 — AB (ref 1.010–1.025)
pH, UA: 5 (ref 5.0–8.0)

## 2020-10-14 MED ORDER — CEPHALEXIN 500 MG PO CAPS: 500.0000 mg | ORAL_CAPSULE | Freq: Two times a day (BID) | ORAL | 0 refills | Status: DC

## 2020-10-14 NOTE — Progress Notes (Signed)
Patient ID: Alison Adams, female    DOB: 1966/02/24, 55 y.o.   MRN: [id number]   Chief Complaint  Patient presents with   Dysuria   Subjective:    HPI Pt having burning with urination and frequency. Tried azo. Started 3 days ago. Pt concerned because she had uti in January also that took two antibiotics to get rid of and never had uti in the past. Helping with azo,  and if having a few glasses of wine or beer will cause it too.  Drinking a lot of water.   Going to [facility]-for well woman exam and pap.   Results for orders placed or performed in visit on 10/14/20  Urine Culture   Specimen: Urine   Urine  Result Value Ref Range   Urine Culture, Routine Final report    Organism ID, Bacteria Comment   Specimen status report  Result Value Ref Range   specimen status report Comment   POCT urinalysis dipstick  Result Value Ref Range   Color, UA     Clarity, UA     Glucose, UA     Bilirubin, UA     Ketones, UA     Spec Grav, UA <=1.005 (A) 1.010 - 1.025   Blood, UA     pH, UA 5.0 5.0 - 8.0   Protein, UA     Urobilinogen, UA     Nitrite, UA     Leukocytes, UA     Appearance     Odor        Medical History Alison Adams has a past medical history of Anxiety, GERD (gastroesophageal reflux disease), Hypertension, and Sweet syndrome.   Outpatient Encounter Medications as of 10/14/2020  Medication Sig   cephALEXin (KEFLEX) 500 MG capsule Take 1 capsule (500 mg total) by mouth 2 (two) times daily.   FLUoxetine (PROZAC) 20 MG capsule Take 40 mg by mouth daily.    lisinopril (ZESTRIL) 2.5 MG tablet Take 1 tablet (2.5 mg total) by mouth daily.   [DISCONTINUED] albuterol (VENTOLIN HFA) 108 (90 Base) MCG/ACT inhaler Inhale 2 puffs into the lungs every 4 (four) hours as needed for wheezing.   [DISCONTINUED] famotidine (PEPCID) 40 MG tablet Take 1 tablet (40 mg total) by mouth daily.   [DISCONTINUED] cephALEXin (KEFLEX) 500 MG capsule Take 1 capsule (500 mg total) by mouth 3 (three)  times daily.   [DISCONTINUED] cyclobenzaprine (FLEXERIL) 5 MG tablet Take 1 tablet (5 mg total) by mouth 3 (three) times daily as needed.   No facility-administered encounter medications on file as of 10/14/2020.     Review of Systems  Constitutional:  Negative for chills and fever.  HENT:  Negative for congestion, rhinorrhea and sore throat.   Respiratory:  Negative for cough, shortness of breath and wheezing.   Cardiovascular:  Negative for chest pain and leg swelling.  Gastrointestinal:  Negative for abdominal pain, diarrhea, nausea and vomiting.  Genitourinary:  Negative for dysuria and frequency.  Musculoskeletal:  Negative for arthralgias and back pain.  Skin:  Negative for rash.  Neurological:  Negative for dizziness, weakness and headaches.    Vitals BP 134/88   Pulse 85   Temp 97.9 F (36.6 C)   Ht 5\' 2"  (1.575 m)   Wt 160 lb (72.6 kg)   LMP 01/24/2015   SpO2 96%   BMI 29.26 kg/m   Objective:   Physical Exam Vitals and nursing note reviewed.  Constitutional:      Appearance: Normal appearance.  HENT:     Head: Normocephalic and atraumatic.     Nose: Nose normal.     Mouth/Throat:     Mouth: Mucous membranes are moist.     Pharynx: Oropharynx is clear.  Eyes:     Extraocular Movements: Extraocular movements intact.     Conjunctiva/sclera: Conjunctivae normal.     Pupils: Pupils are equal, round, and reactive to light.  Cardiovascular:     Rate and Rhythm: Normal rate and regular rhythm.     Pulses: Normal pulses.     Heart sounds: Normal heart sounds.  Pulmonary:     Effort: Pulmonary effort is normal.     Breath sounds: Normal breath sounds. No wheezing, rhonchi or rales.  Abdominal:     General: Abdomen is flat. Bowel sounds are normal. There is no distension.     Palpations: Abdomen is soft. There is no mass.     Tenderness: There is no abdominal tenderness. There is no guarding or rebound.     Hernia: No hernia is present.  Musculoskeletal:         General: Normal range of motion.     Right lower leg: No edema.     Left lower leg: No edema.  Skin:    General: Skin is warm and dry.     Findings: No lesion or rash.  Neurological:     General: No focal deficit present.     Mental Status: She is alert and oriented to person, place, and time.  Psychiatric:        Mood and Affect: Mood normal.        Behavior: Behavior normal.     Assessment and Plan   1. Acute cystitis without hematuria - POCT urinalysis dipstick - Urine Culture - cephALEXin (KEFLEX) 500 MG capsule; Take 1 capsule (500 mg total) by mouth 2 (two) times daily.  Dispense: 14 capsule; Refill: 0 - Specimen status report   On microscopy had many bacteria. On urine dip- inc spec gravity, neg nitrite, LE, or blood. Will treat with keflex.  Pt may continue for 3 days with azo.  Call or rto if worsening.   Will send for culture.  Return if symptoms worsen or fail to improve.

## 2020-10-18 ENCOUNTER — Ambulatory Visit: Payer: BC Managed Care – PPO | Admitting: Family Medicine

## 2020-10-18 LAB — SPECIMEN STATUS REPORT

## 2020-10-18 LAB — URINE CULTURE

## 2020-10-25 ENCOUNTER — Telehealth: Payer: Self-pay | Admitting: Family Medicine

## 2020-10-25 MED ORDER — FAMOTIDINE 40 MG PO TABS: 40.0000 mg | ORAL_TABLET | Freq: Every day | ORAL | 0 refills | Status: DC

## 2020-10-25 NOTE — Telephone Encounter (Signed)
Prescription sent electronically to pharmacy.  Telephone call-mailbox is full

## 2020-10-25 NOTE — Telephone Encounter (Signed)
Patient is requesting refill on famotidine 40 mg to be called into [facility]

## 2020-10-25 NOTE — Progress Notes (Signed)
Telephone call- voicemail full ?

## 2020-10-26 ENCOUNTER — Encounter: Payer: Self-pay | Admitting: Family Medicine

## 2020-10-26 ENCOUNTER — Other Ambulatory Visit: Payer: Self-pay

## 2020-10-26 ENCOUNTER — Ambulatory Visit: Payer: BC Managed Care – PPO | Admitting: Family Medicine

## 2020-10-26 ENCOUNTER — Ambulatory Visit (HOSPITAL_COMMUNITY)
Admission: RE | Admit: 2020-10-26 | Discharge: 2020-10-26 | Disposition: A | Discharge status: Home / Self Care | Payer: BC Managed Care – PPO | Source: Ambulatory Visit | Attending: Family Medicine | Admitting: Family Medicine

## 2020-10-26 VITALS — BP 132/80 | HR 92 | Temp 97.2°F | Ht 62.0 in | Wt 160.0 lb

## 2020-10-26 DIAGNOSIS — R0600 Dyspnea, unspecified: Secondary | ICD-10-CM | POA: Diagnosis present

## 2020-10-26 DIAGNOSIS — R079 Chest pain, unspecified: Secondary | ICD-10-CM | POA: Insufficient documentation

## 2020-10-26 DIAGNOSIS — R0609 Other forms of dyspnea: Secondary | ICD-10-CM

## 2020-10-26 NOTE — Telephone Encounter (Signed)
Patient seen in office by Dr Lorin Picket 10/26/20 at 8:20am

## 2020-10-26 NOTE — Progress Notes (Signed)
   Subjective:    Patient ID: Alison Adams, female    DOB: January 17, 1966, 55 y.o.   MRN: [id number]  HPI Chest pain in center of chest and upper back pain for about 2 weeks. Pain wakes her from sleep. Pain when taking a deep breath. Last about 30 mins to 2 hours to go away.   Center chest with pressure and radiates upper back Present for 10 to 12 days Not every morning No regurg No dysphagia If active has DOE and chest discomfort No diaphoresis No cough No hemoptysis   Pt stopped keflex she was taking for UTI due to it causing a rash. She took it for about one week. Not having any urinary symptoms now.   Review of Systems     Objective:   Physical Exam  General-in no acute distress Eyes-no discharge Lungs-respiratory rate normal, CTA CV-no murmurs,RRR Extremities skin warm dry no edema Neuro grossly normal Behavior normal, alert       Assessment & Plan:  1. Chest pain, unspecified type Having significant chest discomfort.  Wakes her up in the middle of the night around 5 AM or 6 AM substernal radiates to her back she denies regurgitation does not feel this is reflux she also relates shortness of breath with activity with some substernal discomfort with activity family history of dad with premature heart disease and stents patient has history of obesity and elevated cholesterol Urgent referral to cardiology EKG looks good - EKG 12-Lead - DG Chest 2 View - Lipid panel - Basic metabolic panel - Ambulatory referral to Cardiology  2. DOE (dyspnea on exertion) See discussion about her symptoms above - EKG 12-Lead - DG Chest 2 View - Lipid panel - Basic metabolic panel - Ambulatory referral to Cardiology

## 2020-10-27 LAB — LIPID PANEL
Chol/HDL Ratio: 4 ratio (ref 0.0–4.4)
Cholesterol, Total: 216 mg/dL — ABNORMAL HIGH (ref 100–199)
HDL: 54 mg/dL (ref >39)
LDL Chol Calc (NIH): 149 mg/dL — ABNORMAL HIGH (ref 0–99)
Triglycerides: 74 mg/dL (ref 0–149)
VLDL Cholesterol Cal: 13 mg/dL (ref 5–40)

## 2020-10-27 LAB — BASIC METABOLIC PANEL
BUN/Creatinine Ratio: 17 (ref 9–23)
BUN: 13 mg/dL (ref 6–24)
CO2: 24 mmol/L (ref 20–29)
Calcium: 9.5 mg/dL (ref 8.7–10.2)
Chloride: 102 mmol/L (ref 96–106)
Creatinine, Ser: 0.77 mg/dL (ref 0.57–1.00)
Glucose: 71 mg/dL (ref 65–99)
Potassium: 4.3 mmol/L (ref 3.5–5.2)
Sodium: 140 mmol/L (ref 134–144)
eGFR: 91 mL/min/{1.73_m2} (ref >59)

## 2020-10-30 ENCOUNTER — Encounter: Payer: Self-pay | Admitting: Family Medicine

## 2020-11-01 ENCOUNTER — Encounter: Payer: Self-pay | Admitting: Family Medicine

## 2020-11-03 ENCOUNTER — Other Ambulatory Visit: Payer: Self-pay

## 2020-11-03 ENCOUNTER — Encounter: Payer: Self-pay | Admitting: Cardiovascular Disease

## 2020-11-03 ENCOUNTER — Ambulatory Visit: Payer: BC Managed Care – PPO | Admitting: Cardiovascular Disease

## 2020-11-03 VITALS — BP 138/90 | HR 91 | Ht 62.5 in | Wt 161.4 lb

## 2020-11-03 DIAGNOSIS — R079 Chest pain, unspecified: Secondary | ICD-10-CM

## 2020-11-03 MED ORDER — METOPROLOL TARTRATE 100 MG PO TABS: 100.0000 mg | ORAL_TABLET | ORAL | 0 refills | Status: DC

## 2020-11-03 NOTE — Patient Instructions (Signed)
Medication Instructions:  Your physician recommends that you continue on your current medications as directed. Please refer to the Current Medication list given to you today.  Take Lopressor 100 mg Two Hours Prior to CT Scan   *If you need a refill on your cardiac medications before your next appointment, please call your pharmacy*   Lab Work: Your physician recommends that you return for lab work in: One weeks prior to CT Scan.   If you have labs (blood work) drawn today and your tests are completely normal, you will receive your results only by: MyChart Message (if you have MyChart) OR A paper copy in the mail If you have any lab test that is abnormal or we need to change your treatment, we will call you to review the results.   Testing/Procedures: Cardiac CTA    Follow-Up: At [facility], you and your health needs are our priority.  As part of our continuing mission to provide you with exceptional heart care, we have created designated Provider Care Teams.  These Care Teams include your primary Cardiologist (physician) and Advanced Practice Providers (APPs -  Physician Assistants and Nurse Practitioners) who all work together to provide you with the care you need, when you need it.  We recommend signing up for the patient portal called "MyChart".  Sign up information is provided on this After Visit Summary.  MyChart is used to connect with patients for Virtual Visits (Telemedicine).  Patients are able to view lab/test results, encounter notes, upcoming appointments, etc.  Non-urgent messages can be sent to your provider as well.   To learn more about what you can do with MyChart, go to [email or web address].    Your next appointment:    As Needed   The format for your next appointment:   In Person  Provider:   Charlton Haws, MD   Other Instructions Thank you for choosing [facility]!     Your cardiac CT will be scheduled at one of the below locations:    [facility] [address] [place], [place] [address] [phone]  OR  [facility] [address] [place], [place] [address] [phone]  If scheduled at [facility], please arrive at the [facility] main entrance (entrance A) of [facility] 30 minutes prior to test start time. Proceed to the [facility] Radiology Department (first floor) to check-in and test prep.  If scheduled at [facility], please arrive 15 mins early for check-in and test prep.  Please follow these instructions carefully (unless otherwise directed):  On the Night Before the Test: Be sure to Drink plenty of water. Do not consume any caffeinated/decaffeinated beverages or chocolate 12 hours prior to your test. Do not take any antihistamines 12 hours prior to your test.  On the Day of the Test: Drink plenty of water until 1 hour prior to the test. Do not eat any food 4 hours prior to the test. You may take your regular medications prior to the test.  Take metoprolol (Lopressor) two hours prior to test. HOLD Furosemide/Hydrochlorothiazide morning of the test. FEMALES- please wear underwire-free bra if available, avoid dresses & tight clothing   *For Clinical Staff only. Please instruct patient the following:* Heart Rate Medication Recommendations for Cardiac CT  Resting HR < 50 bpm  No medication  Resting HR 50-60 bpm and BP >110/50 mmHG   Consider Metoprolol tartrate 25 mg PO 90-120 min prior to scan  Resting HR 60-65 bpm and BP >110/50 mmHG  Metoprolol tartrate 50 mg PO 90-120 minutes prior to scan   Resting HR > 65 bpm and BP >110/50 mmHG  Metoprolol tartrate 100 mg PO 90-120 minutes prior to scan  Consider Ivabradine 10-15 mg PO or a calcium channel blocker for resting HR >60 bpm and contraindication to metoprolol tartrate  Consider Ivabradine 10-15 mg PO in combination with metoprolol tartrate  for HR >80 bpm         After the Test: Drink plenty of water. After receiving IV contrast, you may experience a mild flushed feeling. This is normal. On occasion, you may experience a mild rash up to 24 hours after the test. This is not dangerous. If this occurs, you can take Benadryl 25 mg and increase your fluid intake. If you experience trouble breathing, this can be serious. If it is severe call 911 IMMEDIATELY. If it is mild, please call our office. If you take any of these medications: Glipizide/Metformin, Avandament, Glucavance, please do not take 48 hours after completing test unless otherwise instructed.  Please allow 2-4 weeks for scheduling of routine cardiac CTs. Some insurance companies require a pre-authorization which may delay scheduling of this test.   For non-scheduling related questions, please contact the cardiac imaging nurse navigator should you have any questions/concerns: Rockwell Alexandria, Cardiac Imaging Nurse Navigator Larey Brick, Cardiac Imaging Nurse Navigator [facility] Direct Office Dial: [phone]   For scheduling needs, including cancellations and rescheduling, please call [place], [phone].

## 2020-11-03 NOTE — Progress Notes (Signed)
CARDIOLOGY CONSULT NOTE       Patient ID: Alison Adams MRN: [id number] DOB/AGE: 10/11/1965 55 y.o.  Admit date: (Not on file) Referring Physician: Gerda Diss Primary Physician: Babs Sciara, MD Primary Cardiologist: New Reason for Consultation: Chest pain  Active Problems:   * No active hospital problems. *   HPI:  55 y.o. referred by Dr Gerda Diss for atypical chest pain. Seen in his office 10/26/20 Complained of central chest and Upper back pain waking her from sleep. Pleuritic component Intermittent pain can last 30 minutes to 2 hours Duration close to 2 weeks No GI overtones Can get exertion dyspnea and pain with activity History of GERD, HTN and anxiety Father had premature CAD.  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 [employer] Has 2 two yo grand babies and 2 daughters in town Husband is retired Patent examiner And served in [place]   ROS All other systems reviewed and negative except as noted above  Past Medical History:  Diagnosis Date   Anxiety    GERD (gastroesophageal reflux disease)    Hypertension    Sweet syndrome     Family History  Problem Relation Age of Onset   Colon cancer Mother    Coronary artery disease Father    Colon cancer Maternal Grandfather 41   Colon polyps Neg Hx     Social History   Socioeconomic History   Marital status: Married    Spouse name: Not on file   Number of children: Not on file   Years of education: Not on file   Highest education level: Not on file  Occupational History   Not on file  Tobacco Use   Smoking status: Never   Smokeless tobacco: Never  Substance and Sexual Activity   Alcohol use: Yes    Alcohol/week: 1.0 standard drink    Types: 1 Glasses of wine per week   Drug use: No   Sexual activity: Not on file  Other  Topics Concern   Not on file  Social History Narrative   Not on file   Social Determinants of Health   Financial Resource Strain: Not on file  Food Insecurity: Not on file  Transportation Needs: Not on file  Physical Activity: Not on file  Stress: Not on file  Social Connections: Not on file  Intimate Partner Violence: Not on file    Past Surgical History:  Procedure Laterality Date   CESAREAN SECTION     COLONOSCOPY     TUBAL LIGATION        Current Outpatient Medications:    famotidine (PEPCID) 40 MG tablet, Take 1 tablet (40 mg total) by mouth daily., Disp: 90 tablet, Rfl: 0   FLUoxetine (PROZAC) 20 MG capsule, Take 40 mg by mouth daily. , Disp: , Rfl:    lisinopril (ZESTRIL) 2.5 MG tablet, Take 1 tablet (2.5 mg total) by mouth daily., Disp: 90 tablet, Rfl: 0    Physical Exam: BP 138/90   Pulse 91   Ht 5' 2.5" (1.588 m)   Wt 73.2 kg   LMP 01/24/2015   SpO2 98%   BMI 29.05 kg/m    Affect appropriate Healthy:  appears stated age HEENT: normal Neck supple with no adenopathy JVP normal no bruits no thyromegaly Lungs clear  with no wheezing and good diaphragmatic motion Heart:  S1/S2 no murmur, no rub, gallop or click PMI normal Abdomen: benighn, BS positve, no tenderness, no AAA no bruit.  No HSM or HJR Distal pulses intact with no bruits No edema Neuro non-focal Skin warm and dry No muscular weakness   Labs:   Lab Results  Component Value Date   WBC 6.9 07/21/2016   HGB 13.1 07/21/2016   HCT 39.8 07/21/2016   MCV 85 07/21/2016   PLT 333 07/21/2016   No results for input(s): NA, K, CL, CO2, BUN, CREATININE, CALCIUM, PROT, BILITOT, ALKPHOS, ALT, AST, GLUCOSE in the last 168 hours.  Invalid input(s): LABALBU Lab Results  Component Value Date   CKTOTAL 75 01/31/2011   CKMB 2.4 01/31/2011   TROPONINI <0.30 01/31/2011    Lab Results  Component Value Date   CHOL 216 (H) 10/26/2020   CHOL 210 (H) 04/26/2020   CHOL 209 (H) 07/21/2016   Lab  Results  Component Value Date   HDL 54 10/26/2020   HDL 54 04/26/2020   HDL 55 07/21/2016   Lab Results  Component Value Date   LDLCALC 149 (H) 10/26/2020   LDLCALC 148 (H) 04/26/2020   LDLCALC 137 (H) 07/21/2016   Lab Results  Component Value Date   TRIG 74 10/26/2020   TRIG 48 04/26/2020   TRIG 85 07/21/2016   Lab Results  Component Value Date   CHOLHDL 4.0 10/26/2020   CHOLHDL 3.9 04/26/2020   CHOLHDL 3.8 07/21/2016   No results found for: LDLDIRECT    Radiology: DG Chest 2 View  Result Date: 10/26/2020 CLINICAL DATA:  Pain in mid chest and upper back for 1-2 weeks. EXAM: CHEST - 2 VIEW COMPARISON:  10/26/2019 FINDINGS: Heart size and mediastinal contours are normal. No pleural effusion or edema identified. No airspace opacities identified. Visualized osseous structures are unremarkable. IMPRESSION: No active cardiopulmonary abnormalities. Electronically Signed   By: Signa Kell M.D.   On: 10/26/2020 10:11    EKG: NSR rate 86 normal    ASSESSMENT AND PLAN:   Chest pain: atypical shared decision making favor cardiac CTA with calcium score  HTN:  Well controlled.  Continue current medications and low sodium Dash type diet.   GERD:  H2 blocker low carb diet   BMET Lopressor 100 mg 2 hour before scan F/U PRN if normal    Signed:  Charlton Haws 11/03/2020, 11:07 AM

## 2020-11-08 ENCOUNTER — Encounter: Payer: Self-pay | Admitting: Family Medicine

## 2020-11-08 ENCOUNTER — Telehealth: Payer: Self-pay | Admitting: Family Medicine

## 2020-11-08 ENCOUNTER — Other Ambulatory Visit: Payer: Self-pay

## 2020-11-08 ENCOUNTER — Other Ambulatory Visit (HOSPITAL_COMMUNITY): Payer: Self-pay | Admitting: *Deleted

## 2020-11-08 ENCOUNTER — Ambulatory Visit (INDEPENDENT_AMBULATORY_CARE_PROVIDER_SITE_OTHER): Payer: BC Managed Care – PPO | Admitting: Family Medicine

## 2020-11-08 VITALS — BP 122/80 | Temp 97.5°F | Wt 159.8 lb

## 2020-11-08 DIAGNOSIS — R079 Chest pain, unspecified: Secondary | ICD-10-CM

## 2020-11-08 MED ORDER — METOPROLOL TARTRATE 100 MG PO TABS: ORAL_TABLET | ORAL | 0 refills | Status: DC

## 2020-11-08 MED ORDER — LISINOPRIL 2.5 MG PO TABS: 2.5000 mg | ORAL_TABLET | Freq: Every day | ORAL | 1 refills | Status: DC

## 2020-11-08 MED ORDER — FAMOTIDINE 40 MG PO TABS: 40.0000 mg | ORAL_TABLET | Freq: Every day | ORAL | 1 refills | Status: DC

## 2020-11-08 NOTE — Telephone Encounter (Signed)
Pt is experiencing Uti like symptoms. As well as when wiping blood is on tolit paper. Pt is wondering if she needs to set up an app or can she come in and do a urine test Or what Dr.Luking wants her to do. Pt would like a call back.

## 2020-11-08 NOTE — Telephone Encounter (Signed)
Patient seen today in office, she forgot to mention she has been having some burning w/ urination, frequency, urinating small amounts, pink blood on toilet paper w/ wiping. Please advise

## 2020-11-08 NOTE — Progress Notes (Signed)
   Subjective:    Patient ID: Alison Adams, female    DOB: 04/29/1965, 55 y.o.   MRN: [id number]  HPI Pt here for follow up on HTN. No issues with blood pressure at this time.  Still getting occasional discomforts early in the morning time.  Denies dysphagia denies wheezing difficulty breathing no coughing no fever no vomiting States medication is working well for her in regards to GERD Moods are doing well Blood pressure doing well  Review of Systems     Objective:   Physical Exam General-in no acute distress Eyes-no discharge Lungs-respiratory rate normal, CTA CV-no murmurs,RRR Extremities skin warm dry no edema Neuro grossly normal Behavior normal, alert        Assessment & Plan:  Blood pressure good control continue current measures  Hyperlipidemia if her cardiac test shows issues she will need to be on statin  If cardiac testing comes back normal will recommend ultrasound to look at gallbladder potential issues and then if that does not delineate the issue may need gastroenterology to endoscopy

## 2020-11-08 NOTE — Patient Instructions (Signed)
We will be watching for the results of your coronary artery CAT scan  We may need to do ultrasound or GI consult depending on results We will connect with you soon after the results TakeCare-Dr. Lorin Picket

## 2020-11-08 NOTE — Telephone Encounter (Signed)
MyChart message was sent this evening Please call patient she may come by the office and submit a urine specimen for urinalysis, urine culture Please make sure that I am aware of the urinalysis being completed and spun That way I can look at it and advise you what to tell her thank you

## 2020-11-09 ENCOUNTER — Other Ambulatory Visit: Payer: Self-pay | Admitting: Family Medicine

## 2020-11-09 ENCOUNTER — Telehealth (HOSPITAL_COMMUNITY): Payer: Self-pay | Admitting: *Deleted

## 2020-11-09 DIAGNOSIS — R300 Dysuria: Secondary | ICD-10-CM

## 2020-11-09 LAB — POCT URINALYSIS DIPSTICK
Spec Grav, UA: <=1.005 — AB (ref 1.010–1.025)
pH, UA: 6 (ref 5.0–8.0)

## 2020-11-09 MED ORDER — CIPROFLOXACIN HCL 500 MG PO TABS: ORAL_TABLET | ORAL | 0 refills | Status: DC

## 2020-11-09 NOTE — Telephone Encounter (Signed)
Pt came in to give urine sample. Dip completed and is spinning at this time. Please advise. Thank you

## 2020-11-09 NOTE — Telephone Encounter (Signed)
Pt contacted and verbalized understanding. Medication sent to pharmacy  

## 2020-11-09 NOTE — Telephone Encounter (Signed)
Urine culture sent I would recommend Cipro 500 mg 1 twice daily until we have the urine culture back Typically 3 days of antibiotics is adequate 6.tablets Please notify us if any problems such as severe flank pain or high fevers

## 2020-11-09 NOTE — Telephone Encounter (Signed)
Reaching out to patient to offer assistance regarding upcoming cardiac imaging study; pt verbalizes understanding of appt date/time, parking situation and where to check in, pre-test NPO status and medications ordered, and verified current allergies; name and call back number provided for further questions should they arise ° °Maebry Obrien RN Navigator Cardiac Imaging °[facility] °[phone] office °[phone] cell  ° °Patient to take 100mg metoprolol tartrate two hours prior to cardiac CT scan. °

## 2020-11-10 ENCOUNTER — Other Ambulatory Visit: Payer: Self-pay

## 2020-11-10 ENCOUNTER — Ambulatory Visit (HOSPITAL_COMMUNITY)
Admission: RE | Admit: 2020-11-10 | Discharge: 2020-11-10 | Disposition: A | Discharge status: Home / Self Care | Payer: BC Managed Care – PPO | Source: Ambulatory Visit | Attending: Cardiovascular Disease | Admitting: Cardiovascular Disease

## 2020-11-10 DIAGNOSIS — R079 Chest pain, unspecified: Secondary | ICD-10-CM | POA: Diagnosis present

## 2020-11-10 DIAGNOSIS — I700 Atherosclerosis of aorta: Secondary | ICD-10-CM | POA: Diagnosis not present

## 2020-11-10 MED ORDER — NITROGLYCERIN 0.4 MG SL SUBL
SUBLINGUAL_TABLET | SUBLINGUAL | Status: AC
  Filled 2020-11-10: qty 2

## 2020-11-10 MED ORDER — NITROGLYCERIN 0.4 MG SL SUBL
0.8000 mg | SUBLINGUAL_TABLET | Freq: Once | SUBLINGUAL | Status: AC
  Administered 2020-11-10: 0.8 mg via SUBLINGUAL

## 2020-11-10 MED ORDER — IOHEXOL 350 MG/ML SOLN
100.0000 mL | Freq: Once | INTRAVENOUS | Status: AC | PRN
  Administered 2020-11-10: 100 mL via INTRAVENOUS

## 2020-11-14 LAB — URINE CULTURE

## 2021-06-23 ENCOUNTER — Other Ambulatory Visit: Payer: Self-pay | Admitting: *Deleted

## 2021-06-23 MED ORDER — LISINOPRIL 2.5 MG PO TABS: 2.5000 mg | ORAL_TABLET | Freq: Every day | ORAL | 0 refills | Status: DC

## 2021-07-11 ENCOUNTER — Ambulatory Visit: Payer: BC Managed Care – PPO | Admitting: Family Medicine

## 2021-07-11 VITALS — BP 128/87 | HR 81 | Temp 97.9°F | Ht 62.5 in | Wt 153.0 lb

## 2021-07-11 DIAGNOSIS — Z79899 Other long term (current) drug therapy: Secondary | ICD-10-CM | POA: Diagnosis not present

## 2021-07-11 DIAGNOSIS — Z1159 Encounter for screening for other viral diseases: Secondary | ICD-10-CM | POA: Diagnosis not present

## 2021-07-11 DIAGNOSIS — I10 Essential (primary) hypertension: Secondary | ICD-10-CM | POA: Diagnosis not present

## 2021-07-11 DIAGNOSIS — Z114 Encounter for screening for human immunodeficiency virus [HIV]: Secondary | ICD-10-CM | POA: Diagnosis not present

## 2021-07-11 DIAGNOSIS — E7849 Other hyperlipidemia: Secondary | ICD-10-CM

## 2021-07-11 MED ORDER — LISINOPRIL 5 MG PO TABS: 5.0000 mg | ORAL_TABLET | Freq: Every day | ORAL | 3 refills | Status: DC

## 2021-07-11 MED ORDER — DOXYCYCLINE HYCLATE 100 MG PO TABS: ORAL_TABLET | ORAL | 0 refills | Status: DC

## 2021-07-11 MED ORDER — FAMOTIDINE 40 MG PO TABS: 40.0000 mg | ORAL_TABLET | Freq: Every day | ORAL | 1 refills | Status: DC

## 2021-07-11 NOTE — Progress Notes (Signed)
? ?  Subjective:  ? ? Patient ID: Alison Adams, female    DOB: 1965-09-10, 56 y.o.   MRN: [id number] ? ?HPI ? ?Patient here for 3 month follow up on blood pressure.  She says blood pressures have been running good.  Taking her medications she feels that her blood pressures been running okay ? ?Patient removed tick yesterday. Tick was on right hand between ring finger and pinky finger.  Right hand is swollen.  Having some swelling in the hand itching burning pain discomfort ?Review of Systems ? ?   ?Objective:  ? Physical Exam ? ?General-in no acute distress ?Eyes-no discharge ?Lungs-respiratory rate normal, CTA ?CV-no murmurs,RRR ?Extremities skin warm dry no edema ?Neuro grossly normal ?Behavior normal, alert ? ?She does have some swelling in the right hand with a small blister where the tick bit her between the ring finger and little finger ? ? ?   ?Assessment & Plan:  ?1. Primary hypertension ?Blood pressure good control continue current measures ?Blood pressure is borderline she will bump up the dose of her lisinopril to bring diastolic down new dose 5 mg ?- HIV antibody (with reflex) ?- Hepatitis C Antibody ?- Lipid Profile ?- Basic Metabolic Panel (BMET) ?- Hepatic function panel ? ?2. High risk medication use ?Labs ordered ?- HIV antibody (with reflex) ?- Hepatitis C Antibody ?- Lipid Profile ?- Basic Metabolic Panel (BMET) ?- Hepatic function panel ? ?3. Screening for HIV (human immunodeficiency virus) ?Labs ordered ?- HIV antibody (with reflex) ?- Hepatitis C Antibody ?- Lipid Profile ?- Basic Metabolic Panel (BMET) ?- Hepatic function panel ? ?4. Need for hepatitis C screening test ?Labs ordered ?- HIV antibody (with reflex) ?- Hepatitis C Antibody ?- Lipid Profile ?- Basic Metabolic Panel (BMET) ?- Hepatic function panel ? ?5. Other hyperlipidemia ?Healthy diet regular activity bring weight down as best as possible she has lost some weight which is a good thing ?- HIV antibody (with reflex) ?- Hepatitis C  Antibody ?- Lipid Profile ?- Basic Metabolic Panel (BMET) ?- Hepatic function panel ? ?Follow-up within 6 to 12 months depending on labs and how she does ?Regular female wellness checkups recommended with her gynecologist ?

## 2021-07-11 NOTE — Patient Instructions (Addendum)
Shingrix and shingles prevention: know the facts! ? ? ?Shingrix is a very effective vaccine to prevent shingles.   ?Shingles is a reactivation of chickenpox -more than 99% of Americans born before 1980 have had chickenpox even if they do not remember it. ?One in every 10 people who get shingles have severe long-lasting nerve pain as a result.   ?33 out of a 100 older adults will get shingles if they are unvaccinated.   ? ? ?This vaccine is very important for your health ?This vaccine is indicated for anyone 50 years or older. ?You can get this vaccine even if you have already had shingles because you can get the disease more than once in a lifetime. ? ?Your risk for shingles and its complications increases with age. ? ?This vaccine has 2 doses.  The second dose would be 2 to 6 months after the first dose.  ?If you had Zostavax vaccine in the past you should still get Shingrix. ?( Zostavax is only 70% effective and it loses significant strength over a few years .) ? ?This vaccine is given through the pharmacy.  The cost of the vaccine is through your insurance. ?The pharmacy can inform you of the total costs. ? ?Common side effects including soreness in the arm, some redness and swelling, also some feel fatigue muscle soreness headache low-grade fever.  Side effects typically go away within 2 to 3 days. ?Remember-the pain from shingles can last a lifetime but these side effects of the vaccine will only last a few days at most. ?It is very important to get both doses in order to protect yourself fully.  ? ?Please get this vaccine at your earliest convenience at your trusted pharmacy. ? ? ? ? ?Tick Bite Information, Adult ?Ticks are insects that draw blood for food. Most ticks live in shrubs and grassy and wooded areas. They climb onto people and animals that brush against the leaves and grasses that they rest on. Then they bite, attaching themselves to the skin. Most ticks are harmless, but some ticks may carry germs  that can spread to a person through a bite and cause a disease. To reduce your risk of getting a disease from a tick bite, make sure you: ?Take steps to prevent tick bites. ?Check for ticks after being outdoors where ticks live. ?Watch for symptoms of disease if a tick attached to you or if you suspect a tick bite. ?How can I prevent tick bites? ?Take these steps to help prevent tick bites when you go outdoors in an area where ticks live: ?Use insect repellent ?Use insect repellent that has DEET (20% or higher), picaridin, or IR3535 in it. Follow the instructions on the label. Use these products on: ?Bare skin. ?The top of your boots. ?Your pant legs. ?Your sleeve cuffs. ?For insect repellent that contains permethrin, follow the instructions on the label. Use these products on: ?Clothing. ?Boots. ?Outdoor gear. ?Tents. ?When you are outside ?Wear protective clothing. Long sleeves and long pants offer the best protection from ticks. ?Wear light-colored clothing so you can see ticks more easily. ?Tuck your pant legs into your socks. ?If you go walking on a trail, stay in the middle of the trail so your skin, hair, and clothing do not touch the bushes. ?Avoid walking through areas with long grass. ?Check for ticks on your clothing, hair, and skin often while you are outside, and check again before you go inside. Make sure to check the scalp, neck, armpits, waist, groin,  and joint areas. These are the spots where ticks attach themselves most often. ?When you go indoors ?Check your clothing for ticks. Tumble dry clothes in a dryer on high heat for at least 10 minutes. If clothes are damp, additional time may be needed. If clothes require washing, use hot water. ?Examine gear and pets. ?Shower soon after being outdoors. ?Check your body for ticks. Conduct a full body check using a mirror. ?What is the proper way to remove a tick? ?If you find a tick on your body, remove it as soon as possible. Removing a tick sooner can  prevent germs from passing to your body. Do not remove the tick with your bare fingers. To remove a tick that is crawling on your skin but has not bitten, use either of these methods: ?Go outdoors and brush the tick off. ?Remove the tick with tape or a lint roller. ?To remove a tick that is attached to your skin: ?Wash your hands. If you have latex gloves, put them on. ?Use fine-tipped tweezers, curved forceps, or a tick-removal tool to gently grasp the tick as close to your skin and the tick's head as possible. ?Gently pull with a steady, upward, even pressure until the tick lets go. ?When removing the tick: ?Take care to keep the tick's head attached to its body. ?Do not twist or jerk the tick. This can make the tick's head or mouth parts break off and remain in the skin. ?Do not squeeze or crush the tick's body. This could force disease-carrying fluids from the tick into your body. ?Do not try to remove a tick with heat, alcohol, petroleum jelly, or fingernail polish. Using these methods can cause the tick to salivate and regurgitate into your bloodstream, increasing your risk of getting a disease. ?What should I do after removing a tick? ?Dispose of the tick. Do not crush a tick with your fingers. ?Clean the bite area and your hands with soap and water, rubbing alcohol, or an iodine scrub. ?If an antiseptic cream or ointment is available, apply a small amount to the bite site. ?Wash and disinfect any instruments that you used to remove the tick. ?How should I dispose of a tick? ?To dispose of a live tick, use one of these methods: ?Place it in rubbing alcohol. ?Place it in a sealed bag or container. ?Wrap it tightly in tape. ?Flush it down the toilet. ?Contact a health care provider if: ?You have symptoms of a disease after a tick bite. Symptoms of a tick-borne disease can occur from moments after the tick bites to 30 days after a tick is removed. Symptoms include: ?Fever or chills. ?Any of these signs in the  bite area: ?A red rash that makes a circle (bull's-eye rash) in the bite area. ?Redness and swelling. ?Headache. ?Muscle, joint, or bone pain. ?Abnormal tiredness. ?Numbness in your legs or difficulty walking or moving your legs. ?Tender, swollen lymph glands. ?A part of a tick breaks off and gets stuck in your skin. ?Get help right away if: ?You are not able to remove a tick. ?You experience muscle weakness or paralysis. ?Your symptoms get worse or you experience new symptoms. ?You find an engorged tick on your skin and you are in an area where disease from ticks is a high risk. ?Summary ?Ticks may carry germs that can spread to a person through a bite and cause a disease. ?Wear protective clothing and use insect repellent to prevent tick bites. Follow the instructions on the label. ?  If you find a tick on your body, remove it as soon as possible. If the tick is attached, do not try to remove with heat, alcohol, petroleum jelly, or fingernail polish. ?Remove the attached tick using fine-tipped tweezers, curved forceps, or a tick-removal tool. Gently pull with steady, upward, even pressure until the tick lets go. Do not twist or jerk the tick. Do not squeeze or crush the tick's body. ?If you have symptoms of a disease after being bitten by a tick, contact a health care provider. ?This information is not intended to replace advice given to you by your health care provider. Make sure you discuss any questions you have with your health care provider. ?Document Revised: 03/30/2019 Document Reviewed: 03/30/2019 ?Elsevier Patient Education ? [place]. ? ?

## 2021-09-19 ENCOUNTER — Encounter: Payer: Self-pay | Admitting: Family Medicine

## 2021-10-13 ENCOUNTER — Ambulatory Visit: Payer: BC Managed Care – PPO | Admitting: Family Medicine

## 2021-10-13 ENCOUNTER — Encounter: Payer: Self-pay | Admitting: Family Medicine

## 2021-10-13 DIAGNOSIS — L255 Unspecified contact dermatitis due to plants, except food: Secondary | ICD-10-CM

## 2021-10-13 MED ORDER — PREDNISONE 10 MG PO TABS: ORAL_TABLET | ORAL | 0 refills | Status: DC

## 2021-10-13 NOTE — Progress Notes (Signed)
Subjective:  Patient ID: Alison Adams, female    DOB: 07/22/1965  Age: 56 y.o. MRN: [id number]  CC: Chief Complaint  Patient presents with   exposure to plant     Pulling weeds x 2 days ago - itching rash spread to face and all over body    HPI:  56 year old female presents for evaluation of rash.  Started 2 days ago.  Started after she was pulling weeds.  She has vesicular rash on her right forearm, hands.  She also has a rash on her face and around her left ear.  She states that the rash is itchy.  She has tried over-the-counter creams without relief.  She is concerned that this may be spreading and she is worried that it will affect her vision.  No complaints or concerns at this time.  Patient Active Problem List   Diagnosis Date Noted   Dermatitis due to plants, including poison ivy, sumac, and oak 10/13/2021   Other hyperlipidemia 07/23/2016   Hypertension 07/23/2016   GERD (gastroesophageal reflux disease) 10/09/2012    Social Hx   Social History   Socioeconomic History   Marital status: Married    Spouse name: Not on file   Number of children: Not on file   Years of education: Not on file   Highest education level: Not on file  Occupational History   Not on file  Tobacco Use   Smoking status: Never   Smokeless tobacco: Never  Substance and Sexual Activity   Alcohol use: Yes    Alcohol/week: 1.0 standard drink of alcohol    Types: 1 Glasses of wine per week   Drug use: No   Sexual activity: Not on file  Other Topics Concern   Not on file  Social History Narrative   Not on file   Social Determinants of Health   Financial Resource Strain: Not on file  Food Insecurity: Not on file  Transportation Needs: Not on file  Physical Activity: Not on file  Stress: Not on file  Social Connections: Not on file    Review of Systems Per HPI  Objective:  BP 124/82   Pulse 84   Temp 98 F (36.7 C)   Ht 5' 2.5" (1.588 m)   Wt 153 lb (69.4 kg)   LMP  01/24/2015   SpO2 97%   BMI 27.54 kg/m      10/13/2021    2:27 PM 07/11/2021    9:16 AM 11/10/2020    3:12 PM  BP/Weight  Systolic BP 124 128 111  Diastolic BP 82 87 81  Wt. (Lbs) 153 153   BMI 27.54 kg/m2 27.54 kg/m2     Physical Exam Constitutional:      General: She is not in acute distress.    Appearance: Normal appearance.  HENT:     Head: Normocephalic and atraumatic.  Pulmonary:     Effort: Pulmonary effort is normal. No respiratory distress.  Skin:    Comments: Erythematous vesicular rash noted -right forearm, left hand.  Raised erythematous rash noted on the face.  Neurological:     Mental Status: She is alert.  Psychiatric:        Mood and Affect: Mood normal.        Behavior: Behavior normal.     Lab Results  Component Value Date   WBC 6.9 07/21/2016   HGB 13.1 07/21/2016   HCT 39.8 07/21/2016   PLT 333 07/21/2016   GLUCOSE 71 10/26/2020  CHOL 216 (H) 10/26/2020   TRIG 74 10/26/2020   HDL 54 10/26/2020   LDLCALC 149 (H) 10/26/2020   ALT 8 01/31/2011   AST 12 01/31/2011   NA 140 10/26/2020   K 4.3 10/26/2020   CL 102 10/26/2020   CREATININE 0.77 10/26/2020   BUN 13 10/26/2020   CO2 24 10/26/2020     Assessment & Plan:   Problem List Items Addressed This Visit       Musculoskeletal and Integument   Dermatitis due to plants, including poison ivy, sumac, and oak    Treating with prednisone.       Meds ordered this encounter  Medications   predniSONE (DELTASONE) 10 MG tablet    Sig: 50 mg daily x 3 days, then 40 mg daily x 3 days, then 30 mg daily x 3 days, then 20 mg daily x 3 days, then 10 mg daily x 3 days.    Dispense:  45 tablet    Refill:  0    Matisse Salais DO [facility]

## 2021-10-13 NOTE — Assessment & Plan Note (Signed)
Treating with prednisone. 

## 2021-11-27 ENCOUNTER — Ambulatory Visit (HOSPITAL_COMMUNITY)
Admission: RE | Admit: 2021-11-27 | Discharge: 2021-11-27 | Disposition: A | Discharge status: Home / Self Care | Payer: BC Managed Care – PPO | Source: Ambulatory Visit | Attending: Family Medicine | Admitting: Family Medicine

## 2021-11-27 ENCOUNTER — Ambulatory Visit: Payer: BC Managed Care – PPO | Admitting: Family Medicine

## 2021-11-27 VITALS — BP 124/82 | Ht 62.5 in | Wt 152.2 lb

## 2021-11-27 DIAGNOSIS — M25522 Pain in left elbow: Secondary | ICD-10-CM | POA: Diagnosis present

## 2021-11-27 DIAGNOSIS — M79632 Pain in left forearm: Secondary | ICD-10-CM

## 2021-11-27 DIAGNOSIS — T148XXA Other injury of unspecified body region, initial encounter: Secondary | ICD-10-CM | POA: Insufficient documentation

## 2021-11-27 NOTE — Patient Instructions (Signed)
Xray's today.   We will call with the results.  Take care  Dr. Adriana Simas

## 2021-11-27 NOTE — Assessment & Plan Note (Signed)
X-rays of the forearm and elbow were negative today (independently reviewed). Patient likely has a hematoma from recent fall. Ice, heat, and supportive care.

## 2021-11-27 NOTE — Progress Notes (Signed)
Subjective:  Patient ID: Alison Adams, female    DOB: [birth year]  Age: 56 y.o. MRN: [id number]  CC: Chief Complaint  Patient presents with   Fall    One month ago fell down hardwood steps and landed on left side and hit head. Left arm still has know and is very sore esp to touch- aches    HPI:  56 year old female presents for evaluation of the above.   Suffered a fall approximately 1 month ago. Hit head and landed on left side. Had dizziness and vomiting (likely had a concussion). She is currently feeling well except for swelling and tenderness of the dorsal aspect of the left forearm. Also having some elbow discomfort. She believes she may have a fracture or hematoma. No relieving factors. No other complaints.   Patient Active Problem List   Diagnosis Date Noted   Hematoma 11/27/2021   Dermatitis due to plants, including poison ivy, sumac, and oak 10/13/2021   Other hyperlipidemia 07/23/2016   Hypertension 07/23/2016   GERD (gastroesophageal reflux disease) 10/09/2012    Social Hx   Social History   Socioeconomic History   Marital status: Married    Spouse name: Not on file   Number of children: Not on file   Years of education: Not on file   Highest education level: Not on file  Occupational History   Not on file  Tobacco Use   Smoking status: Never   Smokeless tobacco: Never  Substance and Sexual Activity   Alcohol use: Yes    Alcohol/week: 1.0 standard drink of alcohol    Types: 1 Glasses of wine per week   Drug use: No   Sexual activity: Not on file  Other Topics Concern   Not on file  Social History Narrative   Not on file   Social Determinants of Health   Financial Resource Strain: Not on file  Food Insecurity: Not on file  Transportation Needs: Not on file  Physical Activity: Not on file  Stress: Not on file  Social Connections: Not on file    Review of Systems Per HPI  Objective:  BP 124/82   Ht 5' 2.5" (1.588 m)   Wt 152 lb 3.2 oz (69  kg)   LMP 01/24/2015   BMI 27.39 kg/m      11/27/2021    3:33 PM 10/13/2021    2:27 PM 07/11/2021    9:16 AM  BP/Weight  Systolic BP 124 124 128  Diastolic BP 82 82 87  Wt. (Lbs) 152.2 153 153  BMI 27.39 kg/m2 27.54 kg/m2 27.54 kg/m2    Physical Exam Constitutional:      General: She is not in acute distress.    Appearance: Normal appearance.  HENT:     Head: Normocephalic and atraumatic.  Pulmonary:     Effort: Pulmonary effort is normal. No respiratory distress.  Musculoskeletal:     Comments: Mild tenderness over the lateral aspect of the left elbow. Left forearm with a discrete area of swelling.  Neurological:     Mental Status: She is alert.  Psychiatric:        Mood and Affect: Mood normal.        Behavior: Behavior normal.     Lab Results  Component Value Date   WBC 6.9 07/21/2016   HGB 13.1 07/21/2016   HCT 39.8 07/21/2016   PLT 333 07/21/2016   GLUCOSE 71 10/26/2020   CHOL 216 (H) 10/26/2020   TRIG 74 10/26/2020  HDL 54 10/26/2020   LDLCALC 149 (H) 10/26/2020   ALT 8 01/31/2011   AST 12 01/31/2011   NA 140 10/26/2020   K 4.3 10/26/2020   CL 102 10/26/2020   CREATININE 0.77 10/26/2020   BUN 13 10/26/2020   CO2 24 10/26/2020     Assessment & Plan:   Problem List Items Addressed This Visit       Other   Hematoma - Primary    X-rays of the forearm and elbow were negative today (independently reviewed). Patient likely has a hematoma from recent fall. Ice, heat, and supportive care.       Other Visit Diagnoses     Left elbow pain       Relevant Orders   DG Elbow Complete Left (Completed)   Pain in left forearm       Relevant Orders   DG Forearm Left (Completed)       Muzamil Harker Adriana Simas DO [facility]

## 2022-02-01 ENCOUNTER — Telehealth: Payer: Self-pay | Admitting: Family Medicine

## 2022-02-01 MED ORDER — LISINOPRIL 5 MG PO TABS: 5.0000 mg | ORAL_TABLET | Freq: Every day | ORAL | 0 refills | Status: DC

## 2022-02-01 MED ORDER — FAMOTIDINE 40 MG PO TABS: 40.0000 mg | ORAL_TABLET | Freq: Every day | ORAL | 0 refills | Status: DC

## 2022-02-01 NOTE — Telephone Encounter (Signed)
Patient is requesting refill on famotidine 40 mg she is completely out send to [facility]

## 2022-02-01 NOTE — Telephone Encounter (Signed)
Patient notified

## 2022-02-01 NOTE — Telephone Encounter (Signed)
Prescription sent electronically to pharmacy. Left message to return call to notify patient. 

## 2022-04-27 ENCOUNTER — Other Ambulatory Visit: Payer: Self-pay | Admitting: Family Medicine

## 2022-06-11 ENCOUNTER — Other Ambulatory Visit: Payer: Self-pay | Admitting: Family Medicine

## 2022-07-19 ENCOUNTER — Other Ambulatory Visit: Payer: Self-pay

## 2022-07-19 ENCOUNTER — Encounter: Payer: Self-pay | Admitting: Family Medicine

## 2022-07-19 ENCOUNTER — Ambulatory Visit: Payer: BC Managed Care – PPO | Admitting: Family Medicine

## 2022-07-19 VITALS — BP 128/81 | HR 81 | Temp 98.5°F | Ht 62.5 in | Wt 157.0 lb

## 2022-07-19 DIAGNOSIS — Z1159 Encounter for screening for other viral diseases: Secondary | ICD-10-CM

## 2022-07-19 DIAGNOSIS — Z114 Encounter for screening for human immunodeficiency virus [HIV]: Secondary | ICD-10-CM

## 2022-07-19 DIAGNOSIS — J020 Streptococcal pharyngitis: Secondary | ICD-10-CM

## 2022-07-19 DIAGNOSIS — J029 Acute pharyngitis, unspecified: Secondary | ICD-10-CM

## 2022-07-19 DIAGNOSIS — E7849 Other hyperlipidemia: Secondary | ICD-10-CM

## 2022-07-19 DIAGNOSIS — I10 Essential (primary) hypertension: Secondary | ICD-10-CM

## 2022-07-19 LAB — POCT RAPID STREP A (OFFICE): Rapid Strep A Screen: POSITIVE — AB

## 2022-07-19 MED ORDER — AMOXICILLIN 500 MG PO TABS: 500.0000 mg | ORAL_TABLET | Freq: Three times a day (TID) | ORAL | 1 refills | Status: DC

## 2022-07-19 NOTE — Progress Notes (Signed)
   Subjective:    Patient ID: Alison Adams, female    DOB: Sep 06, 1965, 57 y.o.   MRN: [id number]  HPI Sore throat 7 days Cough non productive Significant sore throat over the past 7 days a little bit of a dry cough no wheezing or difficulty breathing no vomiting  Review of Systems     Objective:   Physical Exam  Gen-NAD not toxic TMS-normal bilateral T- normal mild redness uvula inflamed Chest-CTA respiratory rate normal no crackles CV RRR no murmur Skin-warm dry Neuro-grossly normal       Assessment & Plan:  Strep throat Airway looks good overall Antibiotics for 10 days Warning signs discussed Follow-up if progressive troubles  Recommend the patient schedule a regular visit later in the spring or early summer do her lab work before that visit She will do screening labs as recommended by CDC including HIV hepatitis is see plus also do her other labs

## 2022-07-31 ENCOUNTER — Ambulatory Visit (INDEPENDENT_AMBULATORY_CARE_PROVIDER_SITE_OTHER): Payer: BC Managed Care – PPO

## 2022-07-31 DIAGNOSIS — H6123 Impacted cerumen, bilateral: Secondary | ICD-10-CM

## 2022-07-31 NOTE — Progress Notes (Signed)
Ear Irrigation

## 2022-08-01 ENCOUNTER — Other Ambulatory Visit: Payer: Self-pay | Admitting: Family Medicine

## 2022-08-02 LAB — BASIC METABOLIC PANEL
BUN/Creatinine Ratio: 17 (ref 9–23)
BUN: 13 mg/dL (ref 6–24)
CO2: 21 mmol/L (ref 20–29)
Calcium: 9.4 mg/dL (ref 8.7–10.2)
Chloride: 104 mmol/L (ref 96–106)
Creatinine, Ser: 0.77 mg/dL (ref 0.57–1.00)
Glucose: 91 mg/dL (ref 70–99)
Potassium: 4.9 mmol/L (ref 3.5–5.2)
Sodium: 142 mmol/L (ref 134–144)
eGFR: 90 mL/min/{1.73_m2} (ref >59)

## 2022-08-02 LAB — LIPID PANEL
Chol/HDL Ratio: 3.5 ratio (ref 0.0–4.4)
Cholesterol, Total: 228 mg/dL — ABNORMAL HIGH (ref 100–199)
HDL: 65 mg/dL (ref >39)
LDL Chol Calc (NIH): 140 mg/dL — ABNORMAL HIGH (ref 0–99)
Triglycerides: 128 mg/dL (ref 0–149)
VLDL Cholesterol Cal: 23 mg/dL (ref 5–40)

## 2022-08-02 LAB — HIV ANTIBODY (ROUTINE TESTING W REFLEX): HIV Screen 4th Generation wRfx: NONREACTIVE

## 2022-08-02 LAB — MICROALBUMIN / CREATININE URINE RATIO
Creatinine, Urine: 32.7 mg/dL
Microalb/Creat Ratio: <9 mg/g creat (ref 0–29)
Microalbumin, Urine: <3 ug/mL

## 2022-08-02 LAB — HEPATITIS C ANTIBODY: Hep C Virus Ab: NONREACTIVE

## 2022-08-03 MED ORDER — FAMOTIDINE 40 MG PO TABS: ORAL_TABLET | ORAL | 0 refills | Status: DC

## 2022-08-31 ENCOUNTER — Ambulatory Visit (INDEPENDENT_AMBULATORY_CARE_PROVIDER_SITE_OTHER): Payer: BC Managed Care – PPO | Admitting: Nurse Practitioner

## 2022-08-31 ENCOUNTER — Encounter: Payer: Self-pay | Admitting: Nurse Practitioner

## 2022-08-31 VITALS — BP 118/83 | HR 79 | Temp 98.2°F | Wt 157.0 lb

## 2022-08-31 DIAGNOSIS — I10 Essential (primary) hypertension: Secondary | ICD-10-CM | POA: Diagnosis not present

## 2022-08-31 DIAGNOSIS — K219 Gastro-esophageal reflux disease without esophagitis: Secondary | ICD-10-CM

## 2022-08-31 DIAGNOSIS — L989 Disorder of the skin and subcutaneous tissue, unspecified: Secondary | ICD-10-CM

## 2022-08-31 MED ORDER — LISINOPRIL 5 MG PO TABS: 5.0000 mg | ORAL_TABLET | Freq: Every day | ORAL | 1 refills | Status: DC

## 2022-08-31 NOTE — Progress Notes (Signed)
Subjective:    Patient ID: Alison Adams, female    DOB: Oct 03, 1965, 57 y.o.   MRN: [id number]  HPI The patient comes in today for a wellness visit.  A review of their health history was completed.  A review of medications was also completed.  Eating habits: good; overall healthy  Regular exercise: walking and stays active working in her yard, also Social worker pt sees on regular basis: GYN for physicals and mammograms; gynecologist prescribes her fluoxetine which she has been on long-term, working very well.  Has never had an eye exam with optometry. Regular dental care. Rare difficulty falling asleep. Has a lesion in the right mid facial area that has been there for a year.  States she will become scaly pill and then heal up.  Also has a new smaller lesion in the right temporal area.  Has seen Dr. Margo Aye local dermatologist in the past. Reflux well-controlled with daily famotidine.    08/31/2022    9:02 AM  Depression screen PHQ 2/9  Decreased Interest 0  Down, Depressed, Hopeless 0  PHQ - 2 Score 0  Altered sleeping 1  Tired, decreased energy 1  Change in appetite 0  Feeling bad or failure about yourself  0  Trouble concentrating 0  Moving slowly or fidgety/restless 0  Suicidal thoughts 0  PHQ-9 Score 2  Difficult doing work/chores Not difficult at all      08/31/2022    9:02 AM  GAD 7 : Generalized Anxiety Score  Nervous, Anxious, on Edge 0  Control/stop worrying 0  Worry too much - different things 0  Trouble relaxing 0  Restless 0  Easily annoyed or irritable 0  Afraid - awful might happen 0  Total GAD 7 Score 0  Anxiety Difficulty Not difficult at all     Review of Systems  Constitutional:  Positive for fatigue. Negative for activity change and appetite change.  HENT:  Negative for sore throat and trouble swallowing.   Respiratory:  Negative for cough, chest tightness and shortness of breath.   Cardiovascular:  Negative for chest pain.   Gastrointestinal:  Negative for abdominal pain, blood in stool, constipation and diarrhea.  Skin:  Positive for rash.       Objective:   Physical Exam NAD.  Alert, oriented.  Calm cheerful affect.  Thyroid nontender to palpation, no mass or goiter noted.  Lungs clear.  Heart regular rate rhythm.  Abdomen soft nondistended nontender without obvious masses.  Well-defined slightly raised moderately erythematous lesion noted in the mid facial area.  A smaller faintly erythematous lesion is noted in the right temporal area.  Multiple tiny cherry angiomas noted on the abdominal wall. Today's Vitals   08/31/22 0818  BP: 118/83  Pulse: 79  Temp: 98.2 F (36.8 C)  SpO2: 97%  Weight: 157 lb (71.2 kg)   Body mass index is 28.26 kg/m.        Assessment & Plan:   Problem List Items Addressed This Visit       Cardiovascular and Mediastinum   Hypertension - Primary   Relevant Medications   lisinopril (ZESTRIL) 5 MG tablet     Digestive   GERD (gastroesophageal reflux disease) (Chronic)   Other Visit Diagnoses     Facial skin lesion          Meds ordered this encounter  Medications   lisinopril (ZESTRIL) 5 MG tablet    Sig: Take 1 tablet (5 mg total) by mouth daily.  Dispense:  90 tablet    Refill:  1    Order Specific Question:   Supervising Provider    Answer:   Lilyan Punt A [[id number]]   Continue current medication regimen as directed.  Follow-up with gynecology in June as planned for her annual physical. Labs up-to-date, recently done by Dr. Lorin Picket on 07/31/2022. Continue famotidine as directed for reflux. Melatonin 10 mg at bedtime for occasional trouble going to sleep. Recommend eye exam with optometry. Recommend shingles vaccine at local pharmacy. Patient to call Dr. Scharlene Gloss office to schedule appointment for evaluation of facial lesion.  Contact our office if she needs a referral. Return in about 1 year (around 08/31/2023).

## 2022-08-31 NOTE — Patient Instructions (Addendum)
Melatonin 10 mg for sleep [place]

## 2022-09-25 LAB — HM MAMMOGRAPHY

## 2022-10-11 ENCOUNTER — Encounter: Payer: Self-pay | Admitting: *Deleted

## 2022-10-24 ENCOUNTER — Encounter: Payer: Self-pay | Admitting: *Deleted

## 2022-10-28 ENCOUNTER — Other Ambulatory Visit: Payer: Self-pay | Admitting: Family Medicine

## 2022-11-16 ENCOUNTER — Telehealth: Payer: Self-pay | Admitting: Family Medicine

## 2022-11-16 ENCOUNTER — Other Ambulatory Visit: Payer: Self-pay | Admitting: Family Medicine

## 2022-11-16 MED ORDER — NIRMATRELVIR/RITONAVIR (PAXLOVID)TABLET: ORAL_TABLET | ORAL | 0 refills | Status: DC

## 2022-11-16 NOTE — Telephone Encounter (Signed)
Patient notified

## 2022-11-16 NOTE — Telephone Encounter (Signed)
Cook, Jayce G, DO   ? ?Rx sent.   ? ?

## 2022-11-16 NOTE — Telephone Encounter (Signed)
Patient tested positive last night for Covid. She has headache,sore throat, cough ,bodyaches,congested. She is requesting paxlovid  called into [facility]

## 2023-01-14 ENCOUNTER — Ambulatory Visit: Payer: BC Managed Care – PPO | Admitting: Family Medicine

## 2023-01-14 VITALS — BP 109/75 | HR 88 | Temp 97.3°F | Ht 62.5 in | Wt 155.0 lb

## 2023-01-14 DIAGNOSIS — N3001 Acute cystitis with hematuria: Secondary | ICD-10-CM | POA: Diagnosis not present

## 2023-01-14 LAB — POCT URINALYSIS DIP (CLINITEK)
Bilirubin, UA: NEGATIVE
Glucose, UA: NEGATIVE mg/dL
Ketones, POC UA: NEGATIVE mg/dL
Nitrite, UA: NEGATIVE
POC PROTEIN,UA: NEGATIVE
Spec Grav, UA: 1.02 (ref 1.010–1.025)
Urobilinogen, UA: 0.2 U/dL
pH, UA: 6.5 (ref 5.0–8.0)

## 2023-01-14 MED ORDER — CEPHALEXIN 500 MG PO CAPS: 500.0000 mg | ORAL_CAPSULE | Freq: Two times a day (BID) | ORAL | 0 refills | Status: DC

## 2023-01-14 NOTE — Progress Notes (Addendum)
Subjective:  Patient ID: Alison Adams, female    DOB: 1965-08-26  Age: 57 y.o. MRN: [id number]  CC: Chief Complaint  Patient presents with   Dysuria   Urinary Frequency    Since friday    HPI:  Discussed the use of AI scribe software for clinical note transcription with the patient, who gave verbal consent to proceed.  History of Present Illness   The patient presents with a suspected UTI, which started on Friday. She describes a burning sensation and increased frequency of urination. She has been self-treating with Azo since Saturday, but stopped taking it today due to concerns about interaction with the urine sample. She denies abdominal pain, fever, chills, back pain, and flank pain. She has a history of UTIs and has previously experienced antibiotic resistance.       Patient Active Problem List   Diagnosis Date Noted   Hematoma 11/27/2021   Dermatitis due to plants, including poison ivy, sumac, and oak 10/13/2021   Other hyperlipidemia 07/23/2016   Hypertension 07/23/2016   GERD (gastroesophageal reflux disease) 10/09/2012    Social Hx   Social History   Socioeconomic History   Marital status: Married    Spouse name: Not on file   Number of children: Not on file   Years of education: Not on file   Highest education level: Bachelor's degree (e.g., BA, AB, BS)  Occupational History   Not on file  Tobacco Use   Smoking status: Never   Smokeless tobacco: Never  Substance and Sexual Activity   Alcohol use: Yes    Alcohol/week: 1.0 standard drink of alcohol    Types: 1 Glasses of wine per week   Drug use: No   Sexual activity: Not on file  Other Topics Concern   Not on file  Social History Narrative   Not on file   Social Determinants of Health   Financial Resource Strain: Patient Declined (07/18/2022)   Overall Financial Resource Strain (CARDIA)    Difficulty of Paying Living Expenses: Patient declined  Food Insecurity: Patient Declined (07/18/2022)   Hunger  Vital Sign    Worried About Running Out of Food in the Last Year: Patient declined    Ran Out of Food in the Last Year: Patient declined  Transportation Needs: No Transportation Needs (07/18/2022)   PRAPARE - Administrator, Civil Service (Medical): No    Lack of Transportation (Non-Medical): No  Physical Activity: Sufficiently Active (07/18/2022)   Exercise Vital Sign    Days of Exercise per Week: 3 days    Minutes of Exercise per Session: 60 min  Stress: Stress Concern Present (07/18/2022)   [employer] of Occupational Health - Occupational Stress Questionnaire    Feeling of Stress : To some extent  Social Connections: Unknown (07/18/2022)   Social Connection and Isolation Panel [NHANES]    Frequency of Communication with Friends and Family: Patient declined    Frequency of Social Gatherings with Friends and Family: Patient declined    Attends Religious Services: Patient declined    Database administrator or Organizations: Patient declined    Attends Engineer, structural: Not on file    Marital Status: Married    Review of Systems Per HPI  Objective:  BP 109/75   Pulse 88   Temp (!) 97.3 F (36.3 C)   Ht 5' 2.5" (1.588 m)   Wt 155 lb (70.3 kg)   LMP 01/24/2015   SpO2 98%   BMI  27.90 kg/m      01/14/2023    3:00 PM 08/31/2022    8:18 AM 07/19/2022    3:47 PM  BP/Weight  Systolic BP 109 118 128  Diastolic BP 75 83 81  Wt. (Lbs) 155 157 157  BMI 27.9 kg/m2 28.26 kg/m2 28.26 kg/m2    Physical Exam Vitals and nursing note reviewed.  Constitutional:      General: She is not in acute distress.    Appearance: Normal appearance.  HENT:     Head: Normocephalic and atraumatic.  Cardiovascular:     Rate and Rhythm: Normal rate and regular rhythm.  Pulmonary:     Effort: Pulmonary effort is normal.     Breath sounds: Normal breath sounds. No wheezing or rales.  Abdominal:     General: There is no distension.     Palpations: Abdomen is soft.      Tenderness: There is no abdominal tenderness.  Neurological:     Mental Status: She is alert.     Lab Results  Component Value Date   WBC 6.9 07/21/2016   HGB 13.1 07/21/2016   HCT 39.8 07/21/2016   PLT 333 07/21/2016   GLUCOSE 91 07/31/2022   CHOL 228 (H) 07/31/2022   TRIG 128 07/31/2022   HDL 65 07/31/2022   LDLCALC 140 (H) 07/31/2022   ALT 8 01/31/2011   AST 12 01/31/2011   NA 142 07/31/2022   K 4.9 07/31/2022   CL 104 07/31/2022   CREATININE 0.77 07/31/2022   BUN 13 07/31/2022   CO2 21 07/31/2022     Assessment & Plan:   Urinary Tract Infection (UTI) Dysuria and urinary frequency without abdominal pain, fever, or flank pain. Urinalysis shows leukocytes and blood. History of UTI with antibiotic resistance. -Start empiric antibiotic therapy with Keflex.  -Send urine for culture and sensitivity.    Meds ordered this encounter  Medications   cephALEXin (KEFLEX) 500 MG capsule    Sig: Take 1 capsule (500 mg total) by mouth 2 (two) times daily.    Dispense:  14 capsule    Refill:  0    Follow-up:  Return if symptoms worsen or fail to improve.  Everlene Other DO [facility]

## 2023-01-16 ENCOUNTER — Encounter: Payer: Self-pay | Admitting: Family Medicine

## 2023-01-16 LAB — URINE CULTURE

## 2023-01-16 LAB — SPECIMEN STATUS REPORT

## 2023-01-21 ENCOUNTER — Other Ambulatory Visit: Payer: Self-pay

## 2023-01-21 ENCOUNTER — Other Ambulatory Visit: Payer: Self-pay | Admitting: Family Medicine

## 2023-01-21 MED ORDER — AMOXICILLIN-POT CLAVULANATE 875-125 MG PO TABS: 1.0000 | ORAL_TABLET | Freq: Two times a day (BID) | ORAL | 0 refills | Status: AC

## 2023-01-21 NOTE — Telephone Encounter (Signed)
Nurses I reviewed over the information I would recommend Augmentin 875 mg 1 taken twice daily for 5 days This can cause diarrhea as a side effect if it becomes too severe we can always switch it to something else that this particular medication tends to be a little bit stronger hopefully it should take care of the problem  If having ongoing urinary and potential infection symptoms after taking this I would recommend follow-up visit to recheck urine please notify us or send this message if any problems  Please send in medication and notify Halayna thank you

## 2023-01-21 NOTE — Telephone Encounter (Signed)
Spoke with th patient and informed her per provider recommendations

## 2023-01-26 ENCOUNTER — Other Ambulatory Visit: Payer: Self-pay | Admitting: Family Medicine

## 2023-04-06 ENCOUNTER — Other Ambulatory Visit: Payer: Self-pay | Admitting: Nurse Practitioner

## 2023-04-27 ENCOUNTER — Other Ambulatory Visit: Payer: Self-pay | Admitting: Family Medicine

## 2023-08-13 ENCOUNTER — Other Ambulatory Visit: Payer: Self-pay | Admitting: Family Medicine

## 2023-08-14 ENCOUNTER — Other Ambulatory Visit: Payer: Self-pay | Admitting: Nurse Practitioner

## 2023-11-07 ENCOUNTER — Other Ambulatory Visit: Payer: Self-pay | Admitting: Nurse Practitioner

## 2023-11-07 ENCOUNTER — Ambulatory Visit: Payer: Self-pay | Admitting: Nurse Practitioner

## 2023-11-07 ENCOUNTER — Ambulatory Visit: Payer: Self-pay

## 2023-11-07 ENCOUNTER — Telehealth: Payer: Self-pay

## 2023-11-07 ENCOUNTER — Ambulatory Visit (INDEPENDENT_AMBULATORY_CARE_PROVIDER_SITE_OTHER)

## 2023-11-07 DIAGNOSIS — R300 Dysuria: Secondary | ICD-10-CM | POA: Diagnosis not present

## 2023-11-07 LAB — POCT URINALYSIS DIP (CLINITEK)
Bilirubin, UA: NEGATIVE
Glucose, UA: NEGATIVE mg/dL
Ketones, POC UA: NEGATIVE mg/dL
Nitrite, UA: NEGATIVE
POC PROTEIN,UA: 30 — AB
Spec Grav, UA: 1.015 (ref 1.010–1.025)
Urobilinogen, UA: 0.2 U/dL
pH, UA: 6.5 (ref 5.0–8.0)

## 2023-11-07 MED ORDER — AMOXICILLIN-POT CLAVULANATE 875-125 MG PO TABS: 1.0000 | ORAL_TABLET | Freq: Two times a day (BID) | ORAL | 0 refills | Status: DC

## 2023-11-07 NOTE — Telephone Encounter (Signed)
 FYI Only or Action Required?: Action required by provider: request for appointment and medication request.  Patient was last seen in primary care on 01/14/2023 by Cook, Jayce G, DO.  Called Nurse Triage reporting Urinary Frequency.  Symptoms began several days ago.  Interventions attempted: Rest, hydration, or home remedies.  Symptoms are: gradually worsening.  Triage Disposition: See Physician Within 24 Hours  Patient/caregiver understands and will follow disposition?: No, refuses disposition                             Copied from CRM [id number]. Topic: Clinical - Red Word Triage >> Nov 07, 2023  8:32 AM Alison Adams wrote: Red Word that prompted transfer to Nurse Triage: Patient calling, stets she thinks she has a UTI, with burning with urination, frequency, and urgency. Reason for Disposition  Urinating more frequently than usual (i.e., frequency) OR new-onset of the feeling of an urgent need to urinate (i.e., urgency)  Answer Assessment - Initial Assessment Questions 1. SYMPTOM: What's the main symptom you're concerned about? (e.g., frequency, incontinence)     Frequency and urgency  2. ONSET: When did the  symptoms  start?     3-4 days 3. PAIN: Is there any pain? If Yes, ask: How bad is it? (Scale: 1-10; mild, moderate, severe)     Denies pain, states it's more of a sensation 4. CAUSE: What do you think is causing the symptoms?     UTI- states she gets frequent UTIs in the summer  5. OTHER SYMPTOMS: Do you have any other symptoms? (e.g., blood in urine, fever, flank pain, pain with urination)     Burning during urination, denies fever, denies abdominal pain, denies flank pain, denies hematuria    No availability in office until next week. Advised UC. Patient declined and stated her PCP typically calls medication in for her. Please advise.  Protocols used: Urinary Symptoms-A-AH

## 2023-11-07 NOTE — Telephone Encounter (Signed)
 Urine test left in lab

## 2023-11-08 NOTE — Telephone Encounter (Signed)
 Treated by Elveria culture pending

## 2023-11-09 LAB — SPECIMEN STATUS REPORT

## 2023-11-09 LAB — URINE CULTURE

## 2023-11-12 ENCOUNTER — Other Ambulatory Visit: Payer: Self-pay | Admitting: Family Medicine

## 2023-11-13 ENCOUNTER — Other Ambulatory Visit: Payer: Self-pay | Admitting: Nurse Practitioner

## 2024-01-17 LAB — HM MAMMOGRAPHY

## 2024-02-11 ENCOUNTER — Other Ambulatory Visit: Payer: Self-pay | Admitting: Family Medicine

## 2024-03-26 ENCOUNTER — Telehealth: Payer: Self-pay | Admitting: *Deleted

## 2024-03-26 NOTE — Telephone Encounter (Signed)
 To set up colonoscopy. Unable to reach. LM with call back #

## 2024-04-22 ENCOUNTER — Telehealth: Payer: Self-pay | Admitting: *Deleted

## 2024-04-22 NOTE — Telephone Encounter (Signed)
 Copied from CRM [id number]. Topic: Referral - Request for Referral >> Apr 22, 2024  9:09 AM Aleatha BROCKS wrote: Did the patient discuss referral with their provider in the last year? No (If No - schedule appointment) (If Yes - send message)  Appointment offered? No  Type of order/referral and detailed reason for visit: Ear doctor  Preference of office, provider, location: [place], someone under patient insurance  If referral order, have you been seen by this specialty before? No (If Yes, this issue or another issue? When? Where?  Can we respond through MyChart? No

## 2024-04-23 NOTE — Telephone Encounter (Signed)
 I would recommend the office visit to evaluate what is going on with the ear  (Certainly if this is a ongoing issue for which she is seeing the ear doctor before we can do a follow-up consult with them but otherwise before doing referral we need to see patient and evaluate thank you-please help arrange appointment with myself or one of the additional clinicians here thank you)

## 2024-04-24 NOTE — Telephone Encounter (Signed)
 I have completed scheduled with Damien Monday for referral to ENT

## 2024-04-27 ENCOUNTER — Ambulatory Visit

## 2024-04-27 VITALS — BP 114/63 | HR 89 | Temp 97.5°F | Ht 62.5 in | Wt 158.4 lb

## 2024-04-27 DIAGNOSIS — H6123 Impacted cerumen, bilateral: Secondary | ICD-10-CM

## 2024-04-27 DIAGNOSIS — J329 Chronic sinusitis, unspecified: Secondary | ICD-10-CM

## 2024-04-27 DIAGNOSIS — B9689 Other specified bacterial agents as the cause of diseases classified elsewhere: Secondary | ICD-10-CM

## 2024-04-27 MED ORDER — FLUTICASONE PROPIONATE 50 MCG/ACT NA SUSP: 2.0000 | Freq: Every day | NASAL | 6 refills | Status: AC

## 2024-04-27 MED ORDER — AMOXICILLIN-POT CLAVULANATE 875-125 MG PO TABS: 1.0000 | ORAL_TABLET | Freq: Two times a day (BID) | ORAL | 0 refills | Status: AC

## 2024-04-27 NOTE — Progress Notes (Signed)
 "  Acute Office Visit  Subjective:     Patient ID: Alison Adams, female    DOB: 17-Aug-1965, 59 y.o.   MRN: [id number]  Chief Complaint  Patient presents with   Cerumen Impaction    PT states both ears are clogged and every time she coughs or yawns they pop  Right ear filled with wax, left is bad but not as much  DR. Luking requested she gets a referral to ENT dr    HPI Patient is in today for bilateral ear impaction.  Says that she does use Q-tips.  Has been seen by Dr. Alphonsa for this before and he recommended that if this happens again to get referred to ENT. Had bilateral ears irrigated at visit. Reports decreased hearing in bilateral ears and ear popping when coughing or yawning.  Also endorses increased sinus pain and pressure, rhinorrhea, headaches, and cough for over a month now. Cough is non-productive. Nasal drainage is clear. Denies fever, fatigue, body aches, or chills.   Review of Systems  Constitutional:  Negative for activity change, appetite change, chills, fatigue and fever.  HENT:  Positive for congestion, postnasal drip, sinus pressure and sinus pain. Negative for rhinorrhea and sore throat.   Respiratory:  Positive for cough. Negative for chest tightness, shortness of breath and wheezing.   Cardiovascular:  Negative for chest pain and palpitations.  Genitourinary:  Negative for difficulty urinating.  Neurological:  Positive for headaches. Negative for dizziness and light-headedness.       Objective:    Today's Vitals   04/27/24 1415  BP: 114/63  Pulse: 89  Temp: (!) 97.5 F (36.4 C)  SpO2: 94%  Weight: 158 lb 6.4 oz (71.8 kg)  Height: 5' 2.5 (1.588 m)   Body mass index is 28.51 kg/m.   Physical Exam Vitals and nursing note reviewed.  Constitutional:      General: She is not in acute distress.    Appearance: Normal appearance. She is not ill-appearing.  HENT:     Right Ear: There is impacted cerumen.     Left Ear: There is impacted cerumen.      Nose: Rhinorrhea present. No congestion.     Right Sinus: Maxillary sinus tenderness and frontal sinus tenderness present.     Left Sinus: Maxillary sinus tenderness and frontal sinus tenderness present.     Mouth/Throat:     Mouth: Mucous membranes are moist.     Pharynx: No oropharyngeal exudate or posterior oropharyngeal erythema.  Cardiovascular:     Rate and Rhythm: Normal rate and regular rhythm.     Heart sounds: Normal heart sounds, S1 normal and S2 normal. No murmur heard. Pulmonary:     Effort: Pulmonary effort is normal. No respiratory distress.     Breath sounds: Normal breath sounds. No wheezing.  Lymphadenopathy:     Cervical: No cervical adenopathy.  Neurological:     Mental Status: She is alert.  Psychiatric:        Mood and Affect: Mood normal.        Behavior: Behavior normal.        Thought Content: Thought content normal.        Judgment: Judgment normal.    No results found for any visits on 04/27/24.    Assessment & Plan:  1. Bilateral impacted cerumen (Primary) -Bilateral ear canals impacted with cerumen. Provided patient with education regarding the use of debrox drops and provided name of medication on check-out sheet.  -Agreed to refer  patient to ENT at this time due to the frequency of this occurring.  -Had nurses irrigate patient's bilateral ears in office. Unable to remove much wax.  - Ambulatory referral to ENT  2. Bacterial sinusitis -Due to duration of symptoms being for over a month and sinus tenderness with palpation on exam will treat for bacterial sinus infection at this time. --Supportive care discussed (hydration, nasal saline, analgesics as appropriate). -Patient advised to complete full antibiotic course and return if symptoms worsen or fail to improve. -ER precautions reviewed.   -Advised patient that if she experiences double worsening, fever, body aches or chills, to let us  know.   -Struck to patient on the use of Flonase . -  fluticasone  (FLONASE ) 50 MCG/ACT nasal spray; Place 2 sprays into both nostrils daily.  Dispense: 16 g; Refill: 6 - amoxicillin -clavulanate (AUGMENTIN ) 875-125 MG tablet; Take 1 tablet by mouth 2 (two) times daily.  Dispense: 14 tablet; Refill: 0   Return if symptoms worsen or fail to improve.   Damien KATHEE Pringle, FNP  "

## 2024-04-27 NOTE — Patient Instructions (Signed)
-  Debrox OTC ear drops

## 2024-05-06 ENCOUNTER — Other Ambulatory Visit: Payer: Self-pay | Admitting: Nurse Practitioner

## 2024-05-08 ENCOUNTER — Telehealth (INDEPENDENT_AMBULATORY_CARE_PROVIDER_SITE_OTHER): Payer: Self-pay | Admitting: Physician Assistant

## 2024-05-08 ENCOUNTER — Encounter (INDEPENDENT_AMBULATORY_CARE_PROVIDER_SITE_OTHER): Payer: Self-pay

## 2024-05-08 NOTE — Telephone Encounter (Signed)
 Left voice message and sent MyChart message to call to reschedule 05/11/2024 appointment.  Office will not be open due to adverse weather conditions.

## 2024-05-11 ENCOUNTER — Institutional Professional Consult (permissible substitution) (INDEPENDENT_AMBULATORY_CARE_PROVIDER_SITE_OTHER): Admitting: Physician Assistant

## 2024-05-22 ENCOUNTER — Encounter (INDEPENDENT_AMBULATORY_CARE_PROVIDER_SITE_OTHER): Payer: Self-pay | Admitting: Physician Assistant

## 2024-05-22 ENCOUNTER — Ambulatory Visit (INDEPENDENT_AMBULATORY_CARE_PROVIDER_SITE_OTHER): Admitting: Physician Assistant

## 2024-05-22 VITALS — BP 129/81 | HR 87 | Temp 97.9°F | Ht 63.0 in | Wt 150.0 lb

## 2024-05-22 DIAGNOSIS — H6123 Impacted cerumen, bilateral: Secondary | ICD-10-CM

## 2024-05-22 NOTE — Progress Notes (Signed)
 Dear Dr. Gerard, Here is my assessment for our mutual patient, Alison Adams. Thank you for allowing me the opportunity to care for your patient. Please do not hesitate to contact me should you have any other questions. Sincerely, Chyrl Cohen PA-C  Otolaryngology Clinic Note Referring provider: Dr. Gerard HPI:  Alison Adams is a 59 y.o. female kindly referred by Dr. Gerard   Discussed the use of AI scribe software for clinical note transcription with the patient, who gave verbal consent to proceed.  History of Present Illness    Alison Adams is a 59 year old female who presents with bilateral cerumen impaction and associated hearing difficulty.  She was initially evaluated by her primary care provider, who identified cerumen impaction in both ears. Water irrigation was attempted twice without success. She regularly uses cotton-tipped applicators for ear cleaning, which may have contributed to the impaction. This is her first episode of cerumen impaction. She is uncertain of the duration, but prior examinations did not note cerumen until recently.  She reports possible decreased hearing in both ears, stating she might be having trouble hearing and only noticed after it was pointed out. She cannot localize which side is worse. She denies significant otalgia but experienced discomfort and pain during prior removal attempts.           Independent Review of Additional Tests or Records:  none   PMH/Meds/All/SocHx/FamHx/ROS:   Past Medical History:  Diagnosis Date   Anxiety    GERD (gastroesophageal reflux disease)    Hypertension    Sweet syndrome      Past Surgical History:  Procedure Laterality Date   CESAREAN SECTION     COLONOSCOPY     TUBAL LIGATION      Family History  Problem Relation Age of Onset   Colon cancer Mother    Coronary artery disease Father    Colon cancer Maternal Grandfather 61   Colon polyps Neg Hx      Social Connections: Socially  Integrated (04/26/2024)   Social Connection and Isolation Panel    Frequency of Communication with Friends and Family: Twice a week    Frequency of Social Gatherings with Friends and Family: Once a week    Attends Religious Services: More than 4 times per year    Active Member of [employer] or Organizations: Yes    Attends Banker Meetings: 1 to 4 times per year    Marital Status: Married     Current Medications[1]   Physical Exam:   BP 129/81   Pulse 87   Temp 97.9 F (36.6 C)   Ht 5' 3 (1.6 m)   Wt 150 lb (68 kg)   LMP 01/24/2015   SpO2 96%   BMI 26.57 kg/m   Pertinent Findings  CN II-XII grossly intact Bilateral cerumen impaction Anterior rhinoscopy: Septum midline; bilateral inferior turbinates with no hypertrophy No lesions of oral cavity/oropharynx; dentition within normal limits No obviously palpable neck masses/lymphadenopathy/thyromegaly No respiratory distress or stridor   Seprately Identifiable Procedures:  None  Impression & Plans:  Alison Adams is a 59 y.o. female with the following   Assessment and Plan    Impacted cerumen, bilateral bilateral cerumen impaction with hard, adherent wax resistant to irrigation. No tympanic membrane damage; mild canal irritation post-removal. Hearing improved post-extraction. Complete extraction achieved without persistent injury or complication. - Performed manual extraction of impacted cerumen using instrumentation and cerumenolytic drops. - Assessed tympanic membranes and ear canals post-removal; confirmed integrity with mild  canal irritation. - Advised follow-up in six months for re-evaluation of cerumen accumulation. - Instructed her to monitor hearing and report persistent decreased hearing; will order audiometry if symptoms do not resolve.           - f/u 6 months    Thank you for allowing me the opportunity to care for your patient. Please do not hesitate to contact me should you have any other  questions.  Sincerely, Chyrl Cohen PA-C [facility] Phone: [phone] Fax: [phone]  05/22/2024, 12:49 PM        [1]  Current Outpatient Medications:    amoxicillin -clavulanate (AUGMENTIN ) 875-125 MG tablet, Take 1 tablet by mouth 2 (two) times daily., Disp: 14 tablet, Rfl: 0   famotidine  (PEPCID ) 40 MG tablet, Take 1 tablet by mouth once daily, Disp: 90 tablet, Rfl: 0   FLUoxetine (PROZAC) 20 MG capsule, Take 40 mg by mouth daily. , Disp: , Rfl:    fluticasone  (FLONASE ) 50 MCG/ACT nasal spray, Place 2 sprays into both nostrils daily., Disp: 16 g, Rfl: 6   lisinopril  (ZESTRIL ) 5 MG tablet, Take 1 tablet by mouth once daily, Disp: 90 tablet, Rfl: 3

## 2024-11-20 ENCOUNTER — Ambulatory Visit (INDEPENDENT_AMBULATORY_CARE_PROVIDER_SITE_OTHER): Admitting: Physician Assistant
